# Patient Record
Sex: Male | Born: 1970 | Race: Black or African American | Hispanic: No | Marital: Married | Smoking: Never smoker
Health system: Southern US, Community
[De-identification: ages and names within clinical notes are randomized; demographics above are authoritative.]

## PROBLEM LIST (undated history)

## (undated) DIAGNOSIS — T7840XA Allergy, unspecified, initial encounter: Secondary | ICD-10-CM

## (undated) DIAGNOSIS — I10 Essential (primary) hypertension: Secondary | ICD-10-CM

## (undated) DIAGNOSIS — M549 Dorsalgia, unspecified: Secondary | ICD-10-CM

## (undated) DIAGNOSIS — E7800 Pure hypercholesterolemia, unspecified: Secondary | ICD-10-CM

## (undated) HISTORY — DX: Allergy, unspecified, initial encounter: T78.40XA

---

## 1999-08-17 ENCOUNTER — Emergency Department (HOSPITAL_COMMUNITY): Admission: EM | Admit: 1999-08-17 | Discharge: 1999-08-17 | Payer: Self-pay | Admitting: Emergency Medicine

## 1999-08-17 ENCOUNTER — Encounter: Payer: Self-pay | Admitting: Emergency Medicine

## 2002-12-25 ENCOUNTER — Emergency Department (HOSPITAL_COMMUNITY): Admission: EM | Admit: 2002-12-25 | Discharge: 2002-12-25 | Payer: Self-pay | Admitting: Emergency Medicine

## 2006-10-13 ENCOUNTER — Emergency Department (HOSPITAL_COMMUNITY): Admission: EM | Admit: 2006-10-13 | Discharge: 2006-10-13 | Payer: Self-pay | Admitting: Family Medicine

## 2007-02-15 ENCOUNTER — Emergency Department (HOSPITAL_COMMUNITY): Admission: EM | Admit: 2007-02-15 | Discharge: 2007-02-15 | Payer: Self-pay | Admitting: Family Medicine

## 2011-06-02 LAB — POCT URINALYSIS DIP (DEVICE)
Bilirubin Urine: NEGATIVE
Glucose, UA: NEGATIVE
Ketones, ur: NEGATIVE
Nitrite: NEGATIVE
Operator id: 116391
Protein, ur: NEGATIVE
Specific Gravity, Urine: 1.02
Urobilinogen, UA: 0.2
pH: 5.5

## 2011-11-10 ENCOUNTER — Emergency Department (HOSPITAL_COMMUNITY)
Admission: EM | Admit: 2011-11-10 | Discharge: 2011-11-10 | Disposition: A | Discharge status: Home / Self Care | Payer: BC Managed Care – PPO | Attending: Family Medicine | Admitting: Family Medicine

## 2011-11-10 ENCOUNTER — Emergency Department (HOSPITAL_COMMUNITY): Payer: BC Managed Care – PPO

## 2011-11-10 ENCOUNTER — Encounter (HOSPITAL_COMMUNITY): Payer: Self-pay | Admitting: Emergency Medicine

## 2011-11-10 DIAGNOSIS — M79671 Pain in right foot: Secondary | ICD-10-CM

## 2011-11-10 DIAGNOSIS — M79609 Pain in unspecified limb: Secondary | ICD-10-CM | POA: Insufficient documentation

## 2011-11-10 NOTE — ED Notes (Signed)
PT. REPORTS RIGHT FOOT PAIN/SWELLING ONSET Wednesday LAST WEEK AFTER PLAYING BASKETBALL , DENIES INJURY OR FALL .

## 2011-11-10 NOTE — Progress Notes (Signed)
Orthopedic Tech Progress Note Patient Details:  Keith French Dec 07, 1970 [id number]  Other Ortho Devices Type of Ortho Device: Other (comment) (ace wrap ) Ortho Device Location: (R) LE Ortho Device Interventions: Application   Jennye Moccasin 11/10/2011, 9:17 PM

## 2011-11-10 NOTE — ED Provider Notes (Signed)
History     CSN: [id number]  Arrival date & time 11/10/11  1844   First MD Initiated Contact with Patient 11/10/11 2008      Chief Complaint  Patient presents with  . Foot Pain    (Consider location/radiation/quality/duration/timing/severity/associated sxs/prior treatment) Patient is a 41 y.o. male presenting with lower extremity pain. The history is provided by the patient. No language interpreter was used.  Foot Pain This is a new problem. The current episode started in the past 7 days. The problem occurs daily. The problem has been unchanged. Associated symptoms include arthralgias. The symptoms are aggravated by exertion and walking.  Patient reports playing basketball last week, developed pain across the top of his right foot.  No known injury.  History reviewed. No pertinent past medical history.  History reviewed. No pertinent past surgical history.  No family history on file.  History  Substance Use Topics  . Smoking status: Never Smoker   . Smokeless tobacco: Not on file  . Alcohol Use: No      Review of Systems  Musculoskeletal: Positive for arthralgias.  All other systems reviewed and are negative.    Allergies  Penicillins  Home Medications  No current outpatient prescriptions on file.  BP 143/88  Pulse 72  Temp(Src) 98.2 F (36.8 C) (Oral)  Resp 16  SpO2 99%  Physical Exam  Nursing note and vitals reviewed. Constitutional: He is oriented to person, place, and time. He appears well-developed and well-nourished.  HENT:  Head: Normocephalic and atraumatic.  Eyes: Conjunctivae are normal. Pupils are equal, round, and reactive to light.  Neck: Normal range of motion. Neck supple.  Cardiovascular: Normal rate, regular rhythm, normal heart sounds and intact distal pulses.   Pulmonary/Chest: Effort normal and breath sounds normal.  Abdominal: Soft. Bowel sounds are normal.  Musculoskeletal: Normal range of motion. He exhibits tenderness.   Feet:  Neurological: He is alert and oriented to person, place, and time.  Skin: Skin is warm and dry.  Psychiatric: He has a normal mood and affect. His behavior is normal. Judgment and thought content normal.    ED Course  Procedures (including critical care time)  Labs Reviewed - No data to display Dg Foot Complete Right  11/10/2011  *RADIOLOGY REPORT*  Clinical Data: 41 year old male with right foot pain following injury.  RIGHT FOOT COMPLETE - 3+ VIEW  Comparison: None  Findings: There is no evidence of acute fracture, subluxation, or dislocation. The Lisfranc joints are intact. No focal bony lesions are identified. There is no evidence of radiopaque foreign body.  The joint spaces are unremarkable.  IMPRESSION: No bony abnormalities identified.  Original Report Authenticated By: Rosendo Gros, M.D.     No diagnosis found.  No evidence of bony abnormality on foot films.    Likely foot sprain.  MDM          Jimmye Norman, NP 11/10/11 2039

## 2011-11-10 NOTE — ED Notes (Signed)
States he has pain on top of right foot. States he was playing basketball 2 days ago. Noticed pain in foot after playing game. Denies any injury.

## 2011-11-10 NOTE — Discharge Instructions (Signed)
Foot Sprain  The muscles and cord like structures which attach muscle to bone (tendons) that surround the feet are made up of units. A foot sprain can occur at the weakest spot in any of these units. This condition is most often caused by injury to or overuse of the foot, as from playing contact sports, or aggravating a previous injury, or from poor conditioning, or obesity.  SYMPTOMS  · Pain with movement of the foot.  · Tenderness and swelling at the injury site.  · Loss of strength is present in moderate or severe sprains.  THE THREE GRADES OR SEVERITY OF FOOT SPRAIN ARE:  · Mild (Grade I): Slightly pulled muscle without tearing of muscle or tendon fibers or loss of strength.  · Moderate (Grade II): Tearing of fibers in a muscle, tendon, or at the attachment to bone, with small decrease in strength.  · Severe (Grade III): Rupture of the muscle-tendon-bone attachment, with separation of fibers. Severe sprain requires surgical repair. Often repeating (chronic) sprains are caused by overuse. Sudden (acute) sprains are caused by direct injury or over-use.  DIAGNOSIS   Diagnosis of this condition is usually by your own observation. If problems continue, a caregiver may be required for further evaluation and treatment. X-rays may be required to make sure there are not breaks in the bones (fractures) present. Continued problems may require physical therapy for treatment.  PREVENTION  · Use strength and conditioning exercises appropriate for your sport.  · Warm up properly prior to working out.  · Use athletic shoes that are made for the sport you are participating in.  · Allow adequate time for healing. Early return to activities makes repeat injury more likely, and can lead to an unstable arthritic foot that can result in prolonged disability. Mild sprains generally heal in 3 to 10 days, with moderate and severe sprains taking 2 to 10 weeks. Your caregiver can help you determine the proper time required for  healing.  HOME CARE INSTRUCTIONS   · Apply ice to the injury for 15 to 20 minutes, 3 to 4 times per day. Put the ice in a plastic bag and place a towel between the bag of ice and your skin.  · An elastic wrap (like an Ace bandage) may be used to keep swelling down.  · Keep foot above the level of the heart, or at least raised on a footstool, when swelling and pain are present.  · Try to avoid use other than gentle range of motion while the foot is painful. Do not resume use until instructed by your caregiver. Then begin use gradually, not increasing use to the point of pain. If pain does develop, decrease use and continue the above measures, gradually increasing activities that do not cause discomfort, until you gradually achieve normal use.  · Use crutches if and as instructed, and for the length of time instructed.  · Keep injured foot and ankle wrapped between treatments.  · Massage foot and ankle for comfort and to keep swelling down. Massage from the toes up towards the knee.  · Only take over-the-counter or prescription medicines for pain, discomfort, or fever as directed by your caregiver.  SEEK IMMEDIATE MEDICAL CARE IF:   · Your pain and swelling increase, or pain is not controlled with medications.  · You have loss of feeling in your foot or your foot turns cold or blue.  · You develop new, unexplained symptoms, or an increase of the symptoms that brought you   to your caregiver.  MAKE SURE YOU:   · Understand these instructions.  · Will watch your condition.  · Will get help right away if you are not doing well or get worse.  Document Released: 01/23/2002 Document Revised: 07/23/2011 Document Reviewed: 03/22/2008  Exit[employer]® Patient Information ©2012 [employer].

## 2011-11-11 NOTE — ED Provider Notes (Signed)
Medical screening examination/treatment/procedure(s) were performed by non-physician practitioner and as supervising physician I was immediately available for consultation/collaboration.   Celene Kras, MD 11/11/11 [phone]

## 2012-01-06 ENCOUNTER — Ambulatory Visit: Payer: BC Managed Care – PPO | Admitting: Internal Medicine

## 2012-01-06 DIAGNOSIS — Z0289 Encounter for other administrative examinations: Secondary | ICD-10-CM

## 2014-02-20 ENCOUNTER — Encounter (HOSPITAL_COMMUNITY): Payer: Self-pay | Admitting: Emergency Medicine

## 2014-02-20 ENCOUNTER — Emergency Department (HOSPITAL_COMMUNITY)
Admission: EM | Admit: 2014-02-20 | Discharge: 2014-02-20 | Disposition: A | Discharge status: Home / Self Care | Payer: BC Managed Care – PPO | Attending: Emergency Medicine | Admitting: Emergency Medicine

## 2014-02-20 DIAGNOSIS — R21 Rash and other nonspecific skin eruption: Secondary | ICD-10-CM

## 2014-02-20 DIAGNOSIS — Z880 Allergy status to penicillin: Secondary | ICD-10-CM | POA: Insufficient documentation

## 2014-02-20 DIAGNOSIS — IMO0002 Reserved for concepts with insufficient information to code with codable children: Secondary | ICD-10-CM | POA: Insufficient documentation

## 2014-02-20 HISTORY — DX: Dorsalgia, unspecified: M54.9

## 2014-02-20 MED ORDER — DEXAMETHASONE SODIUM PHOSPHATE 10 MG/ML IJ SOLN
10.0000 mg | Freq: Once | INTRAMUSCULAR | Status: AC
  Administered 2014-02-20: 10 mg via INTRAVENOUS
  Filled 2014-02-20: qty 1

## 2014-02-20 MED ORDER — HYDROXYZINE HCL 25 MG PO TABS: 25.0000 mg | ORAL_TABLET | Freq: Four times a day (QID) | ORAL | Status: DC

## 2014-02-20 MED ORDER — FAMOTIDINE IN NACL 20-0.9 MG/50ML-% IV SOLN
20.0000 mg | Freq: Once | INTRAVENOUS | Status: AC
  Administered 2014-02-20: 20 mg via INTRAVENOUS
  Filled 2014-02-20: qty 50

## 2014-02-20 MED ORDER — FAMOTIDINE 20 MG PO TABS: 20.0000 mg | ORAL_TABLET | Freq: Two times a day (BID) | ORAL | Status: DC | PRN

## 2014-02-20 MED ORDER — SODIUM CHLORIDE 0.9 % IV BOLUS (SEPSIS)
1000.0000 mL | Freq: Once | INTRAVENOUS | Status: AC
  Administered 2014-02-20: 1000 mL via INTRAVENOUS

## 2014-02-20 NOTE — ED Notes (Signed)
Joni ReiningNicole, [place], and Dr. Manus Gunningancour at the bedside.

## 2014-02-20 NOTE — ED Notes (Signed)
Discussed patient condition to WebsterNicole, [place]. Recent medications he took, and large rash on upper back.  She acknowledges and will see patient soon.

## 2014-02-20 NOTE — ED Notes (Signed)
Patient reports he took benadryl around 3 today that didn't help.

## 2014-02-20 NOTE — ED Notes (Signed)
Patient states that he took a 800mg  Ibuprofen and a muscle relaxer last night and woke up this am with upper back swelling and itchiness.  Patient has area on upper back/shoulders that is red and areas of edema.  Patient denies any shortness of breath or tongue swelling.  Patient does not have any other areas on body.  Patient is CAOx3.

## 2014-02-20 NOTE — ED Notes (Signed)
Joni ReiningNicole, PA-C at the bedside. Updated on patient's rash, small improvement of reddness noted in middle of back.  No further spreading noted.

## 2014-02-20 NOTE — Discharge Instructions (Signed)
Please follow with your primary care doctor in the next 2 days for a check-up. They must obtain records for further management.  ° °Do not hesitate to return to the Emergency Department for any new, worsening or concerning symptoms.  ° °

## 2014-02-20 NOTE — ED Provider Notes (Signed)
CSN: [id number]     Arrival date & time 02/20/14  1850 History  This chart was scribed for non-physician practitioner, Wynetta EmeryNicole Roselani Grajeda, PA-C working with Glynn OctaveStephen Rancour, MD by Greggory StallionKayla Andersen, ED scribe. This patient was seen in room TR07C/TR07C and the patient's care was started at 7:46 PM.   Chief Complaint  Patient presents with  . Allergic Reaction   The history is provided by the patient. No language interpreter was used.   HPI Comments: Keith LyonsBrian L Reale is a 43 y.o. male who presents to the Emergency Department complaining of gradual onset, worsening rash to his upper back that started this morning when he woke up. States there is redness, swelling and itching. Pt states he took 800 mg ibuprofen and flexeril last night. States he has taken ibuprofen in the past and has never had any problems. He is unsure if he might be having an allergic reaction to one of them. Denies new soaps, lotions, detergents or pets. Pt has taken benadryl with no relief. Denies lip swelling, tongue swelling, trouble swallowing, difficulty breathing, wheezing, SOB, abdominal pain, nausea, emesis.   Past Medical History  Diagnosis Date  . Back pain    History reviewed. No pertinent past surgical history. History reviewed. No pertinent family history. History  Substance Use Topics  . Smoking status: Never Smoker   . Smokeless tobacco: Not on file  . Alcohol Use: Yes     Comment: socially    Review of Systems  HENT: Negative for facial swelling and trouble swallowing.   Respiratory: Negative for shortness of breath and wheezing.   Gastrointestinal: Negative for nausea, vomiting and abdominal pain.  Skin: Positive for rash.  All other systems reviewed and are negative.  Allergies  Penicillins  Home Medications   Prior to Admission medications   Medication Sig Start Date End Date Taking? Authorizing Provider  diphenhydrAMINE (BENADRYL) 2 % cream Apply 1 application topically 3 (three) times daily as  needed for itching.   Yes Historical Provider, MD  famotidine (PEPCID) 20 MG tablet Take 1 tablet (20 mg total) by mouth 2 (two) times daily as needed (itching). 02/20/14   Madelynne Lasker, PA-C  hydrOXYzine (ATARAX/VISTARIL) 25 MG tablet Take 1 tablet (25 mg total) by mouth every 6 (six) hours. 02/20/14   Alfons Sulkowski, PA-C   BP 137/91  Pulse 62  Temp(Src) 98.6 F (37 C) (Oral)  Resp 20  SpO2 100%  Physical Exam  Nursing note and vitals reviewed. Constitutional: He is oriented to person, place, and time. He appears well-developed and well-nourished. No distress.  HENT:  Head: Normocephalic and atraumatic.  Mouth/Throat: Oropharynx is clear and moist.  Eyes: Conjunctivae and EOM are normal. Pupils are equal, round, and reactive to light.  Cardiovascular: Normal rate, regular rhythm and intact distal pulses.   Pulmonary/Chest: Effort normal and breath sounds normal. No stridor. No respiratory distress. He has no wheezes. He has no rales. He exhibits no tenderness.  No stridor or drooling. No posterior pharynx edema, lip or tongue swelling. Pt reclining comfortably, speaking in complete sentences.   No Wheezing, excellent air movement in all fields.     Abdominal: Soft. There is no tenderness.  Musculoskeletal: Normal range of motion.  Neurological: He is alert and oriented to person, place, and time.  Skin: Rash noted.     Erythematous indurated rash to upper back(see diagram) clear drainage. Peau d'orange appearance  Psychiatric: He has a normal mood and affect.    ED Course  Procedures (including  critical care time)  DIAGNOSTIC STUDIES: Oxygen Saturation is 98% on RA, normal by my interpretation.    COORDINATION OF CARE: 7:48 PM-Discussed treatment plan which includes speaking with Dr. Manus Gunningancour with pt at bedside and pt agreed to plan.   7:49 PM-Dr. Manus Gunningancour evaluated pt. He suggests decadron and observing pt for a little bit.  Labs Review Labs Reviewed - No data to  display  Imaging Review No results found.   EKG Interpretation None      MDM   Final diagnoses:  Rash    Filed Vitals:   02/20/14 1912 02/20/14 2125  BP: 153/88 137/91  Pulse: 80 62  Temp: 98.9 F (37.2 C) 98.6 F (37 C)  TempSrc: Oral Oral  Resp: 16 20  SpO2: 98% 100%    Medications  dexamethasone (DECADRON) injection 10 mg (10 mg Intravenous Given 02/20/14 2006)  famotidine (PEPCID) IVPB 20 mg (0 mg Intravenous Stopped 02/20/14 2036)  sodium chloride 0.9 % bolus 1,000 mL (0 mLs Intravenous Stopped 02/20/14 2112)    Keith French is a 43 y.o. male presenting with pruritic rash to upper back worsening over the course of the day. Patient had ibuprofen and Flexeril which he had never had before last night. Does not meet criteria for anaphylaxis and epinephrine administration: no multisystem involvement, airway compromise or hypotension. Pt given IVF, decadron, benadryl and pepcid with significant improvement in ED. Discharged with meds for symptom control.  This is a shared visit with the attending physician who personally evaluated the patient and agrees with the care plan.   Evaluation does not show pathology that would require ongoing emergent intervention or inpatient treatment. Pt is hemodynamically stable and mentating appropriately. Discussed findings and plan with patient/guardian, who agrees with care plan. All questions answered. Return precautions discussed and outpatient follow up given.   Discharge Medication List as of 02/20/2014 10:09 PM    START taking these medications   Details  famotidine (PEPCID) 20 MG tablet Take 1 tablet (20 mg total) by mouth 2 (two) times daily as needed (itching)., Starting 02/20/2014, Until Discontinued, Print    hydrOXYzine (ATARAX/VISTARIL) 25 MG tablet Take 1 tablet (25 mg total) by mouth every 6 (six) hours., Starting 02/20/2014, Until Discontinued, Print           I personally performed the services described in this  documentation, which was scribed in my presence. The recorded information has been reviewed and is accurate.  Joni Reiningicole Noami Bove, PA-C 02/21/14 [phone]

## 2014-02-21 NOTE — ED Provider Notes (Signed)
Medical screening examination/treatment/procedure(s) were conducted as a shared visit with non-physician practitioner(s) and myself.  I personally evaluated the patient during the encounter.  Pruritic, indurated area to upper back after taking ibuprofen and flexeril. No CP or SOB. No oral or genital lesions.   EKG Interpretation None       Glynn OctaveStephen Kamaiyah Uselton, MD 02/21/14 [phone]

## 2016-03-05 ENCOUNTER — Emergency Department (HOSPITAL_COMMUNITY)
Admission: EM | Admit: 2016-03-05 | Discharge: 2016-03-05 | Disposition: A | Discharge status: Home / Self Care | Payer: BC Managed Care – PPO | Attending: Emergency Medicine | Admitting: Emergency Medicine

## 2016-03-05 ENCOUNTER — Encounter (HOSPITAL_COMMUNITY): Payer: Self-pay | Admitting: *Deleted

## 2016-03-05 DIAGNOSIS — H6123 Impacted cerumen, bilateral: Secondary | ICD-10-CM

## 2016-03-05 NOTE — ED Notes (Signed)
The pt has been unable to hear frommhis rt ear for  2 days no paijn  No distress

## 2016-03-05 NOTE — ED Provider Notes (Signed)
CSN: [id number]     Arrival date & time 03/05/16  0311 History   First MD Initiated Contact with Patient 03/05/16 0424     Chief Complaint  Patient presents with  . Ear Fullness     (Consider location/radiation/quality/duration/timing/severity/associated sxs/prior Treatment) HPI Comments: Patient reports decreased hearing in the right ear without pain, drainage, fever, congestion or sore throat. He states the left ear is starting to feel clogged. History of wax build up.  Patient is a 45 y.o. male presenting with plugged ear sensation. The history is provided by the patient. No language interpreter was used.  Ear Fullness This is a new problem. The current episode started in the past 7 days. The problem occurs constantly. Pertinent negatives include no congestion, coughing, fever, nausea or sore throat.    Past Medical History  Diagnosis Date  . Back pain    History reviewed. No pertinent past surgical history. No family history on file. Social History  Substance Use Topics  . Smoking status: Never Smoker   . Smokeless tobacco: None  . Alcohol Use: Yes     Comment: socially    Review of Systems  Constitutional: Negative for fever.  HENT: Positive for hearing loss. Negative for congestion, sore throat and tinnitus.   Respiratory: Negative for cough.   Gastrointestinal: Negative for nausea.      Allergies  Penicillins  Home Medications   Prior to Admission medications   Not on File   BP 161/100 mmHg  Pulse 70  Temp(Src) 97.8 F (36.6 C) (Oral)  Resp 17  Ht 6\' 1"  (1.854 m)  Wt 108.183 kg  BMI 31.47 kg/m2  SpO2 99% Physical Exam  Constitutional: He is oriented to person, place, and time. He appears well-developed and well-nourished.  HENT:  Bilateral cerumen impaction. External ear nontender to movement.   Neck: Normal range of motion.  Pulmonary/Chest: Effort normal.  Musculoskeletal: Normal range of motion.  Neurological: He is alert and oriented to  person, place, and time.  Skin: Skin is warm and dry.  Psychiatric: He has a normal mood and affect.    ED Course  Procedures (including critical care time) Labs Review Labs Reviewed - No data to display  Imaging Review No results found. I have personally reviewed and evaluated these images and lab results as part of my medical decision-making.   EKG Interpretation None      MDM   Final diagnoses:  Cerumen impaction, bilateral    Lavage provided for complete clearing of bilateral external ear canals. Patient is asymptomatic.    Elpidio AnisShari Remmy Riffe, PA-C 03/05/16 [phone]   Raeford RazorStephen Kohut, MD 03/09/16 [phone]

## 2016-03-05 NOTE — Discharge Instructions (Signed)
Cerumen Impaction The structures of the external ear canal secrete a waxy substance known as cerumen. Excess cerumen can build up in the ear canal, causing a condition known as cerumen impaction. Cerumen impaction can cause ear pain and disrupt the function of the ear. The rate of cerumen production differs for each individual. In certain individuals, the configuration of the ear canal may decrease his or her ability to naturally remove cerumen. CAUSES Cerumen impaction is caused by excessive cerumen production or buildup. RISK FACTORS  Frequent use of swabs to clean ears.  Having narrow ear canals.  Having eczema.  Being dehydrated. SIGNS AND SYMPTOMS  Diminished hearing.  Ear drainage.  Ear pain.  Ear itch. TREATMENT Treatment may involve:  Over-the-counter or prescription ear drops to soften the cerumen.  Removal of cerumen by a health care provider. This may be done with:  Irrigation with warm water. This is the most common method of removal.  Ear curettes and other instruments.  Surgery. This may be done in severe cases. HOME CARE INSTRUCTIONS  Take medicines only as directed by your health care provider.  Do not insert objects into the ear with the intent of cleaning the ear. PREVENTION  Do not insert objects into the ear, even with the intent of cleaning the ear. Removing cerumen as a part of normal hygiene is not necessary, and the use of swabs in the ear canal is not recommended.  Drink enough water to keep your urine clear or pale yellow.  Control your eczema if you have it. SEEK MEDICAL CARE IF:  You develop ear pain.  You develop bleeding from the ear.  The cerumen does not clear after you use ear drops as directed.   This information is not intended to replace advice given to you by your health care provider. Make sure you discuss any questions you have with your health care provider.   Document Released: 09/10/2004 Document Revised: 08/24/2014  Document Reviewed: 03/20/2015 Elsevier Interactive Patient Education 2016 [employer]  

## 2016-03-05 NOTE — ED Notes (Signed)
Irrigated bilateral ears - able to see eardrum in each ear. Patient reports he is able to hear better.

## 2016-06-23 ENCOUNTER — Ambulatory Visit
Admission: EM | Admit: 2016-06-23 | Discharge: 2016-06-23 | Disposition: A | Discharge status: Home / Self Care | Payer: BLUE CROSS/BLUE SHIELD | Attending: Family Medicine | Admitting: Family Medicine

## 2016-06-23 ENCOUNTER — Encounter: Payer: Self-pay | Admitting: *Deleted

## 2016-06-23 DIAGNOSIS — S39012A Strain of muscle, fascia and tendon of lower back, initial encounter: Secondary | ICD-10-CM | POA: Diagnosis not present

## 2016-06-23 MED ORDER — CYCLOBENZAPRINE HCL 10 MG PO TABS: 10.0000 mg | ORAL_TABLET | Freq: Every day | ORAL | 0 refills | Status: DC

## 2016-06-23 MED ORDER — HYDROCODONE-ACETAMINOPHEN 5-325 MG PO TABS: ORAL_TABLET | ORAL | 0 refills | Status: DC

## 2016-06-23 NOTE — ED Provider Notes (Signed)
[facility]    CSN: [id number] Arrival date & time: 06/23/16  1553     History   Chief Complaint Chief Complaint  Patient presents with  . Back Pain    HPI Keith French is a 45 y.o. male.   The history is provided by the patient.  Back Pain  Location:  Lumbar spine Quality:  Aching Radiates to:  L posterior upper leg Pain severity:  Moderate Pain is:  Same all the time Onset quality:  Sudden Duration:  2 weeks Timing:  Constant Progression:  Worsening Chronicity:  New Context comment:  Getting out of shower felt sudden sharp pain; denies any falls or direct trauma injury Relieved by:  Nothing Ineffective treatments:  NSAIDs and OTC medications Associated symptoms: no abdominal pain, no abdominal swelling, no bladder incontinence, no bowel incontinence, no chest pain, no dysuria, no fever, no headaches, no leg pain, no numbness, no paresthesias, no pelvic pain, no perianal numbness, no tingling, no weakness and no weight loss   Risk factors: no hx of cancer, no hx of osteoporosis, no lack of exercise, no menopause, not obese, not pregnant, no recent surgery, no steroid use and no vascular disease     Past Medical History:  Diagnosis Date  . Back pain     There are no active problems to display for this patient.   History reviewed. No pertinent surgical history.     Home Medications    Prior to Admission medications   Medication Sig Start Date End Date Taking? Authorizing Provider  cyclobenzaprine (FLEXERIL) 10 MG tablet Take 1 tablet (10 mg total) by mouth at bedtime. 06/23/16   Payton Mccallumrlando Jodee Wagenaar, MD  HYDROcodone-acetaminophen (NORCO/VICODIN) 5-325 MG tablet 1-2 tabs po qhs prn 06/23/16   Payton Mccallumrlando Aleksandr Pellow, MD    Family History History reviewed. No pertinent family history.  Social History Social History  Substance Use Topics  . Smoking status: Never Smoker  . Smokeless tobacco: Never Used  . Alcohol use Yes     Comment: socially      Allergies   Penicillins   Review of Systems Review of Systems  Constitutional: Negative for fever and weight loss.  Cardiovascular: Negative for chest pain.  Gastrointestinal: Negative for abdominal pain and bowel incontinence.  Genitourinary: Negative for bladder incontinence, dysuria and pelvic pain.  Musculoskeletal: Positive for back pain.  Neurological: Negative for tingling, weakness, numbness, headaches and paresthesias.     Physical Exam Triage Vital Signs ED Triage Vitals  Enc Vitals Group     BP 06/23/16 1701 (!) 160/107     Pulse Rate 06/23/16 1701 74     Resp 06/23/16 1701 16     Temp 06/23/16 1701 97.9 F (36.6 C)     Temp Source 06/23/16 1701 Oral     SpO2 06/23/16 1701 98 %     Weight 06/23/16 1702 231 lb (104.8 kg)     Height 06/23/16 1702 6\' 1"  (1.854 m)     Head Circumference --      Peak Flow --      Pain Score 06/23/16 1705 8     Pain Loc --      Pain Edu? --      Excl. in [place]? --    No data found.   Updated Vital Signs BP (!) 160/107 (BP Location: Left Arm)   Pulse 74   Temp 97.9 F (36.6 C) (Oral)   Resp 16   Ht 6\' 1"  (1.854 m)   Wt  231 lb (104.8 kg)   SpO2 98%   BMI 30.48 kg/m   Visual Acuity Right Eye Distance:   Left Eye Distance:   Bilateral Distance:    Right Eye Near:   Left Eye Near:    Bilateral Near:     Physical Exam  Constitutional: He appears well-developed and well-nourished. No distress.  Neck: Normal range of motion. Neck supple. No tracheal deviation present.  Pulmonary/Chest: Effort normal. No stridor. No respiratory distress.  Musculoskeletal:       Cervical back: He exhibits normal range of motion, no tenderness, no bony tenderness, no swelling, no edema, no deformity, no laceration, no pain, no spasm and normal pulse.       Lumbar back: He exhibits tenderness (over the left  lumbar paraspinous muscles) and spasm. He exhibits normal range of motion, no bony tenderness, no swelling, no edema, no deformity,  no laceration, no pain and normal pulse.  Neurological: He is alert. He has normal reflexes. He displays normal reflexes. He exhibits normal muscle tone. Coordination normal.  Skin: No rash noted. He is not diaphoretic.  Nursing note and vitals reviewed.    UC Treatments / Results  Labs (all labs ordered are listed, but only abnormal results are displayed) Labs Reviewed - No data to display  EKG  EKG Interpretation None       Radiology No results found.  Procedures Procedures (including critical care time)  Medications Ordered in UC Medications - No data to display   Initial Impression / Assessment and Plan / UC Course  I have reviewed the triage vital signs and the nursing notes.  Pertinent labs & imaging results that were available during my care of the patient were reviewed by me and considered in my medical decision making (see chart for details).  Clinical Course       Final Clinical Impressions(s) / UC Diagnoses   Final diagnoses:  Strain of lumbar region, initial encounter    New Prescriptions Discharge Medication List as of 06/23/2016  5:24 PM    START taking these medications   Details  cyclobenzaprine (FLEXERIL) 10 MG tablet Take 1 tablet (10 mg total) by mouth at bedtime., Starting Tue 06/23/2016, Normal    HYDROcodone-acetaminophen (NORCO/VICODIN) 5-325 MG tablet 1-2 tabs po qhs prn, Print       1. diagnosis reviewed with patient 2. rx as per orders above; reviewed possible side effects, interactions, risks and benefits  3. Recommend supportive treatment with heat, stretches 4. Follow-up prn if symptoms worsen or don't improve   Payton Mccallumrlando Kahla Risdon, MD 06/23/16 1941

## 2016-06-23 NOTE — ED Triage Notes (Signed)
LS spine region pain, left side, x3 weeks. Worse today. Denies injury.

## 2016-06-26 ENCOUNTER — Telehealth: Payer: Self-pay | Admitting: *Deleted

## 2016-06-26 NOTE — Telephone Encounter (Signed)
Courtesy call back, verified DOB, patient reported feeling the same, but does have a follow up appointment with PCP. Advised patient to follow up with [facility] or PCP if symptoms become worse.

## 2016-11-23 ENCOUNTER — Ambulatory Visit: Payer: Self-pay | Admitting: Family Medicine

## 2018-04-12 ENCOUNTER — Other Ambulatory Visit: Payer: Self-pay | Admitting: Family Medicine

## 2018-04-12 DIAGNOSIS — R945 Abnormal results of liver function studies: Principal | ICD-10-CM

## 2018-04-12 DIAGNOSIS — R7989 Other specified abnormal findings of blood chemistry: Secondary | ICD-10-CM

## 2018-04-25 ENCOUNTER — Other Ambulatory Visit: Payer: BLUE CROSS/BLUE SHIELD

## 2018-04-26 ENCOUNTER — Ambulatory Visit
Admission: RE | Admit: 2018-04-26 | Discharge: 2018-04-26 | Disposition: A | Discharge status: Home / Self Care | Payer: BLUE CROSS/BLUE SHIELD | Source: Ambulatory Visit | Attending: Family Medicine | Admitting: Family Medicine

## 2018-04-26 DIAGNOSIS — R945 Abnormal results of liver function studies: Principal | ICD-10-CM

## 2018-04-26 DIAGNOSIS — R7989 Other specified abnormal findings of blood chemistry: Secondary | ICD-10-CM

## 2018-04-27 ENCOUNTER — Other Ambulatory Visit: Payer: BLUE CROSS/BLUE SHIELD

## 2019-12-05 ENCOUNTER — Ambulatory Visit (INDEPENDENT_AMBULATORY_CARE_PROVIDER_SITE_OTHER): Payer: Non-veteran care

## 2019-12-05 ENCOUNTER — Ambulatory Visit
Admission: EM | Admit: 2019-12-05 | Discharge: 2019-12-05 | Disposition: A | Discharge status: Home / Self Care | Payer: Non-veteran care | Attending: Family Medicine | Admitting: Family Medicine

## 2019-12-05 ENCOUNTER — Encounter: Payer: Self-pay | Admitting: Emergency Medicine

## 2019-12-05 ENCOUNTER — Other Ambulatory Visit: Payer: Self-pay

## 2019-12-05 DIAGNOSIS — S92154A Nondisplaced avulsion fracture (chip fracture) of right talus, initial encounter for closed fracture: Secondary | ICD-10-CM | POA: Diagnosis not present

## 2019-12-05 DIAGNOSIS — W1789XA Other fall from one level to another, initial encounter: Secondary | ICD-10-CM | POA: Diagnosis not present

## 2019-12-05 HISTORY — DX: Pure hypercholesterolemia, unspecified: E78.00

## 2019-12-05 HISTORY — DX: Essential (primary) hypertension: I10

## 2019-12-05 MED ORDER — MELOXICAM 15 MG PO TABS: 15.0000 mg | ORAL_TABLET | Freq: Every day | ORAL | 0 refills | Status: DC | PRN

## 2019-12-05 NOTE — Discharge Instructions (Addendum)
Rest, ice, elevation.  Medication as prescribed.  See Podiatry for follow up.   Take care  Dr. Adriana Simas

## 2019-12-05 NOTE — ED Triage Notes (Signed)
Patient c/o twisting his right ankle on Friday after jumping off of a trailer. He is c/o pain and swelling.

## 2019-12-05 NOTE — ED Provider Notes (Signed)
[facility]    CSN: [id number] Arrival date & time: 12/05/19  0810  History   Chief Complaint Chief Complaint  Patient presents with  . Ankle Injury    right  . Ankle Pain   HPI  49 year old male presents with an ankle injury.  Patient states that he jumped down from a trailer on Friday and twisted his right ankle.  He states that initially he was doing okay but has been doing a fair amount of walking.  As a result he feels like he is pain has worsened.  He states that he was unable to ambulate today.  He reports pain and swelling of the right ankle.  He is most tender around the medial malleolus.  Rates his pain as 7/10 in severity.  Described as aching.  No relieving factors.  No other reported symptoms.  No other complaints.  Past Medical History:  Diagnosis Date  . Back pain   . High cholesterol   . Hypertension    Home Medications    Prior to Admission medications   Medication Sig Start Date End Date Taking? Authorizing Provider  amLODipine (NORVASC) 10 MG tablet Take 10 mg by mouth daily. 10/04/19   [provider]  atorvastatin (LIPITOR) 10 MG tablet Take 10 mg by mouth daily. 10/04/19   [provider]  meloxicam (MOBIC) 15 MG tablet Take 1 tablet (15 mg total) by mouth daily as needed for pain. 12/05/19   Coral Spikes, DO  metoprolol succinate (TOPROL-XL) 50 MG 24 hr tablet Take 50 mg by mouth daily. 10/04/19   [provider]   Social History Social History   Tobacco Use  . Smoking status: Never Smoker  . Smokeless tobacco: Never Used  Substance Use Topics  . Alcohol use: Yes    Comment: socially  . Drug use: No   Allergies   Penicillins   Review of Systems Review of Systems  Constitutional: Negative.   Musculoskeletal:       Right ankle pain and swelling.   Physical Exam Triage Vital Signs ED Triage Vitals  Enc Vitals Group     BP 12/05/19 0842 (!) 154/87     Pulse Rate 12/05/19 0842 75     Resp 12/05/19 0842  18     Temp 12/05/19 0842 98.2 F (36.8 C)     Temp Source 12/05/19 0842 Oral     SpO2 12/05/19 0842 100 %     Weight 12/05/19 0840 245 lb (111.1 kg)     Height 12/05/19 0840 6' (1.829 m)     Head Circumference --      Peak Flow --      Pain Score 12/05/19 0840 7     Pain Loc --      Pain Edu? --      Excl. in [place]? --    Updated Vital Signs BP (!) 154/87 (BP Location: Left Arm)   Pulse 75   Temp 98.2 F (36.8 C) (Oral)   Resp 18   Ht 6' (1.829 m)   Wt 111.1 kg   SpO2 100%   BMI 33.23 kg/m   Visual Acuity Right Eye Distance:   Left Eye Distance:   Bilateral Distance:    Right Eye Near:   Left Eye Near:    Bilateral Near:     Physical Exam Vitals and nursing note reviewed.  Constitutional:      General: He is not in acute distress.  Appearance: Normal appearance. He is not ill-appearing.  HENT:     Head: Normocephalic and atraumatic.  Eyes:     General:        Right eye: No discharge.        Left eye: No discharge.     Conjunctiva/sclera: Conjunctivae normal.  Pulmonary:     Effort: Pulmonary effort is normal. No respiratory distress.  Musculoskeletal:     Comments: Right ankle -diffuse swelling and tenderness over the medial malleolus.  Neurological:     Mental Status: He is alert.  Psychiatric:        Mood and Affect: Mood normal.        Behavior: Behavior normal.    UC Treatments / Results  Labs (all labs ordered are listed, but only abnormal results are displayed) Labs Reviewed - No data to display  EKG   Radiology DG Ankle Complete Right  Result Date: 12/05/2019 CLINICAL DATA:  Pain and swelling of the right ankle since a twisting injury 3 days ago. EXAM: RIGHT ANKLE - COMPLETE 3+ VIEW COMPARISON:  Radiographs of the right foot dated 11/10/2011 FINDINGS: There is no fracture or dislocation. Tiny avulsion of bone from the dorsal distal aspect of the talus which is new since the prior study. Multiple old avulsions from the tip of the medial  malleolus. These were present on the prior radiographs of 11/10/2011. There is circumferential soft tissue swelling at the ankle. IMPRESSION: Tiny avulsion of bone from the dorsal distal aspect of the talus. Soft tissue swelling. Electronically Signed   By: Francene Boyers M.D.   On: 12/05/2019 09:33    Procedures Procedures (including critical care time)  Medications Ordered in UC Medications - No data to display  Initial Impression / Assessment and Plan / UC Course  I have reviewed the triage vital signs and the nursing notes.  Pertinent labs & imaging results that were available during my care of the patient were reviewed by me and considered in my medical decision making (see chart for details).    49 year old male presents with an ankle injury.  X-ray revealed an avulsion fracture of the talus.  Nonweightbearing.  Crutches.  Advised to see podiatry.  Meloxicam as directed.  Supportive care.  Final Clinical Impressions(s) / UC Diagnoses   Final diagnoses:  Closed nondisplaced avulsion fracture of right talus, initial encounter     Discharge Instructions     Rest, ice, elevation.  Medication as prescribed.  See Podiatry for follow up.   Take care  Dr. Adriana Simas     ED Prescriptions    Medication Sig Dispense Auth. Provider   meloxicam (MOBIC) 15 MG tablet Take 1 tablet (15 mg total) by mouth daily as needed for pain. 30 tablet Tommie Sams, DO     PDMP not reviewed this encounter.   Tommie Sams, DO 12/05/19 1011

## 2019-12-24 ENCOUNTER — Other Ambulatory Visit: Payer: Self-pay

## 2019-12-24 ENCOUNTER — Encounter: Payer: Self-pay | Admitting: Emergency Medicine

## 2019-12-24 ENCOUNTER — Emergency Department
Admission: EM | Admit: 2019-12-24 | Discharge: 2019-12-24 | Disposition: A | Discharge status: Home / Self Care | Attending: Emergency Medicine | Admitting: Emergency Medicine

## 2019-12-24 ENCOUNTER — Emergency Department

## 2019-12-24 DIAGNOSIS — S93401D Sprain of unspecified ligament of right ankle, subsequent encounter: Secondary | ICD-10-CM | POA: Insufficient documentation

## 2019-12-24 DIAGNOSIS — R2241 Localized swelling, mass and lump, right lower limb: Secondary | ICD-10-CM | POA: Diagnosis not present

## 2019-12-24 DIAGNOSIS — X58XXXD Exposure to other specified factors, subsequent encounter: Secondary | ICD-10-CM | POA: Insufficient documentation

## 2019-12-24 DIAGNOSIS — S92154D Nondisplaced avulsion fracture (chip fracture) of right talus, subsequent encounter for fracture with routine healing: Secondary | ICD-10-CM | POA: Diagnosis present

## 2019-12-24 NOTE — ED Notes (Signed)
Pt has swelling in his right foot from where he twisted his ankle recently and got a mild fracture. Pt states that he didn't follow up with the ortho surgeon and that he's been walking on it and now it is more painful.

## 2019-12-24 NOTE — ED Triage Notes (Signed)
Pt states approx 3 weeks ago patient partially fractured his ankle, was given a brace, pt states continued to walk on it and now has continued and increased pain to R ankle.

## 2019-12-24 NOTE — Discharge Instructions (Addendum)
Your exam and x-ray today are consistent with a grade 2 ankle sprain and joint effusion.  No evidence of underlying fracture.  Previous bone avulsion appears resolved at this time.  Wear the cam walker for ambulation using crutches as needed.  Rest with the foot elevated, apply ice to reduce swelling.  Perform regular ankle exercises throughout the day.  Consider Epson salt soaks to reduce swelling.  Follow-up with Dr. Orland Jarred for ongoing symptom management.

## 2019-12-24 NOTE — ED Provider Notes (Signed)
[facility] Emergency Department Provider Note ____________________________________________  Time seen: 1135  I have reviewed the triage vital signs and the nursing notes.  HISTORY  Chief Complaint  Ankle Pain  HPI Keith French is a 49 y.o. male presents himself to the ED for subsequent evaluation of right ankle pain.  Patient describes 3 weeks ago he was evaluated and treated with initial fracture care of an avulsion to the right talar dome.  He was placed in a brace at that time and referred to podiatry.  Patient admits to continued pain and swelling to the right ankle.  He denies any interim injury, but has been using crutches to ambulate in the interim.   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  Past Medical History:  Diagnosis Date  . Back pain   . High cholesterol   . Hypertension     There are no problems to display for this patient.   History reviewed. No pertinent surgical history.  Prior to Admission medications   Medication Sig Start Date End Date Taking? Authorizing Provider  amLODipine (NORVASC) 10 MG tablet Take 10 mg by mouth daily. 10/04/19   [provider]  atorvastatin (LIPITOR) 10 MG tablet Take 10 mg by mouth daily. 10/04/19   [provider]  meloxicam (MOBIC) 15 MG tablet Take 1 tablet (15 mg total) by mouth daily as needed for pain. 12/05/19   Coral Spikes, DO  metoprolol succinate (TOPROL-XL) 50 MG 24 hr tablet Take 50 mg by mouth daily. 10/04/19   [provider]    Allergies Penicillins  History reviewed. No pertinent family history.  Social History Social History   Tobacco Use  . Smoking status: Never Smoker  . Smokeless tobacco: Never Used  Substance Use Topics  . Alcohol use: Yes    Comment: socially  . Drug use: No    Review of Systems  Constitutional: Negative for fever. Cardiovascular: Negative for chest pain. Respiratory: Negative  for shortness of breath. Musculoskeletal: Negative for back pain.  Right ankle pain as above. Skin: Negative for rash. Neurological: Negative for headaches, focal weakness or numbness. ____________________________________________  PHYSICAL EXAM:  VITAL SIGNS: ED Triage Vitals  Enc Vitals Group     BP 12/24/19 1113 (!) 142/98     Pulse Rate 12/24/19 1113 77     Resp 12/24/19 1113 18     Temp 12/24/19 1113 98.2 F (36.8 C)     Temp Source 12/24/19 1113 Oral     SpO2 12/24/19 1113 98 %     Weight 12/24/19 1110 245 lb (111.1 kg)     Height 12/24/19 1110 6' (1.829 m)     Head Circumference --      Peak Flow --      Pain Score 12/24/19 1110 3     Pain Loc --      Pain Edu? --      Excl. in [place]? --     Constitutional: Alert and oriented. Well appearing and in no distress. Head: Normocephalic and atraumatic. Eyes: Conjunctivae are normal. Normal extraocular movements Cardiovascular: Normal rate, regular rhythm. Normal distal pulses. Respiratory: Normal respiratory effort. No wheezes/rales/rhonchi. Gastrointestinal: Soft and nontender. No distention. Musculoskeletal: Right ankle without obvious deformity or dislocation.  Patient with a moderate joint effusion appreciated.  He is tender to palpation to the medial and dorsal ankle.  Active range of motion is appreciated.  No calf or, tenderness is elicited.  Negative anterior/posterior drawer.  Nontender with normal range of motion in all extremities.  Neurologic: Antalgic gait without ataxia. Normal gross sensation. No gross focal neurologic deficits are appreciated. Skin:  Skin is warm, dry and intact. No rash noted. ____________________________________________   RADIOLOGY  DG Right Ankle  IMPRESSION: Prior trauma medial malleolus, stable in appearance. The previously noted small avulsion arising from the dorsal distal talus is no longer evident. No acute appearing fracture is evident on current examination. Note that there is a  joint effusion. Question underlying ligamentous injury. _________________________________________  PROCEDURES  Ace bandage CAM Walker  Procedures ____________________________________________  INITIAL IMPRESSION / ASSESSMENT AND PLAN / ED COURSE  Patient with subsequent ED evaluation for continued right ankle pain and swelling.  Patient with soft tissue swelling and disability to the right ankle 3 weeks following initial evaluation.  He was found to have a questionable avulsion to the dorsal talus on that visit.  That bone defect is now resolved today.  With no indication of acute fracture or dislocation.  Patient is placed in a cam walker for support, and is again referred to podiatry for ongoing symptom management.  He is advised to rest, ice, elevate the foot, and perform range of motion exercises when seated.  He is also advised to discontinue any extracurricular landscaping activities for the next 2 weeks.  Return precautions have been reviewed.  CLOIS TREANOR was evaluated in Emergency Department on 12/24/2019 for the symptoms described in the history of present illness. He was evaluated in the context of the global COVID-19 pandemic, which necessitated consideration that the patient might be at risk for infection with the SARS-CoV-2 virus that causes COVID-19. Institutional protocols and algorithms that pertain to the evaluation of patients at risk for COVID-19 are in a state of rapid change based on information released by regulatory bodies including the CDC and federal and state organizations. These policies and algorithms were followed during the patient's care in the ED. ____________________________________________  FINAL CLINICAL IMPRESSION(S) / ED DIAGNOSES  Final diagnoses:  Sprain of right ankle, unspecified ligament, subsequent encounter      Lissa Hoard, PA-C 12/24/19 1206    Dionne Bucy, MD 12/24/19 1601

## 2021-01-20 ENCOUNTER — Ambulatory Visit (HOSPITAL_BASED_OUTPATIENT_CLINIC_OR_DEPARTMENT_OTHER): Admitting: Family Medicine

## 2021-01-23 ENCOUNTER — Other Ambulatory Visit: Payer: Self-pay

## 2021-01-23 ENCOUNTER — Ambulatory Visit (HOSPITAL_BASED_OUTPATIENT_CLINIC_OR_DEPARTMENT_OTHER): Admitting: Family Medicine

## 2021-01-23 ENCOUNTER — Encounter (HOSPITAL_BASED_OUTPATIENT_CLINIC_OR_DEPARTMENT_OTHER): Payer: Self-pay | Admitting: Family Medicine

## 2021-01-23 DIAGNOSIS — M25571 Pain in right ankle and joints of right foot: Secondary | ICD-10-CM | POA: Diagnosis not present

## 2021-01-23 DIAGNOSIS — I10 Essential (primary) hypertension: Secondary | ICD-10-CM | POA: Diagnosis not present

## 2021-01-23 DIAGNOSIS — E669 Obesity, unspecified: Secondary | ICD-10-CM | POA: Diagnosis not present

## 2021-01-23 DIAGNOSIS — E785 Hyperlipidemia, unspecified: Secondary | ICD-10-CM | POA: Diagnosis not present

## 2021-01-23 DIAGNOSIS — G8929 Other chronic pain: Secondary | ICD-10-CM

## 2021-01-23 MED ORDER — AMLODIPINE BESYLATE 10 MG PO TABS: 10.0000 mg | ORAL_TABLET | Freq: Every day | ORAL | 1 refills | Status: DC

## 2021-01-23 MED ORDER — METOPROLOL SUCCINATE ER 50 MG PO TB24: 50.0000 mg | ORAL_TABLET | Freq: Every day | ORAL | 1 refills | Status: DC

## 2021-01-23 MED ORDER — ATORVASTATIN CALCIUM 10 MG PO TABS: 10.0000 mg | ORAL_TABLET | Freq: Every day | ORAL | 1 refills | Status: DC

## 2021-01-23 NOTE — Progress Notes (Signed)
New Patient Office Visit  Subjective:  Patient ID: Keith French, male    DOB: 1971/01/15  Age: 50 y.o. MRN: [id number]  CC:  Chief Complaint  Patient presents with   Establish Care    Est Care - Former PCP Dr. Patria Mane at [facility]   Ankle Pain    Patient states he sprained his right ankle about a year ago and he is still having residual stiffness and pain. Patient states he was told he could have some bone spurs from the injury. He states that if he sits for a long time the ankle stiffens and is painful    HPI Keith French is a 50 year old male presenting to establish clinic.  He has current concerns today related to chronic right ankle pain.  Past medical history significant for dyslipidemia, hypertension.  Chronic right ankle pain: Reports that about 1 year ago he had an injury to the ankle when he was jumping down from a truck.  He has had intermittent discomfort/pain since then.  Endorses history of chronic right ankle injuries due to prior sports activities.  Thinks he might of had a fracture in the past as well, this did not require surgery.  Will have pain over medial and lateral aspect of ankle.  Pain will be worse when he has been sitting for a while and then goes to stand up and move around.  No significant swelling presently.  Hypertension: Currently taking amlodipine and metoprolol, has been on these for about 2 years.  Denies any issues with chest pain, lightheadedness, dizziness, headaches.  Needing refills on medications today.  Dyslipidemia: Has been taking atorvastatin for about 2 years.  Denies any issues with myalgias.  Has been tolerating medication well.  Patient works as a Optometrist.  Past Medical History:  Diagnosis Date   Back pain    High cholesterol    Hypertension     History reviewed. No pertinent surgical history.  Family History  Problem Relation Age of Onset   Hypertension Mother    Prostate cancer Father 31   Heart disease  Maternal Grandmother     Social History   Socioeconomic History   Marital status: Married    Spouse name: Not on file   Number of children: Not on file   Years of education: Not on file   Highest education level: Not on file  Occupational History   Not on file  Tobacco Use   Smoking status: Never   Smokeless tobacco: Never  Substance and Sexual Activity   Alcohol use: Yes    Comment: socially   Drug use: No   Sexual activity: Not on file  Other Topics Concern   Not on file  Social History Narrative   Not on file   Social Determinants of Health   Financial Resource Strain: Not on file  Food Insecurity: Not on file  Transportation Needs: Not on file  Physical Activity: Not on file  Stress: Not on file  Social Connections: Not on file  Intimate Partner Violence: Not on file    Objective:   Today's Vitals: BP 130/84   Pulse 65   Ht 6' (1.829 m)   Wt 250 lb 3.2 oz (113.5 kg)   SpO2 98%   BMI 33.93 kg/m   Physical Exam  Pleasant 50 year old male in no acute distress Cardiovascular exam with regular rate and rhythm, no murmurs appreciated Lungs clear to auscultation bilaterally Right ankle: No obvious deformity of right ankle.  No significant tenderness to palpation over ATFL, at location is where patient reports lateral ankle pain is primarily located.  No significant tenderness palpation along posterior tibial tendon, however this is location of primary medial sided ankle pain. Full ROM for plantarflexion, dorsiflexion, inversion and eversion.  Strength 5 out of 5 for all planes of motion. Neurovascularly intact.  No evidence of lymphatic disease.   Assessment & Plan:   Problem List Items Addressed This Visit       Cardiovascular and Mediastinum   Hypertension   Relevant Medications   amLODipine (NORVASC) 10 MG tablet   atorvastatin (LIPITOR) 10 MG tablet   metoprolol succinate (TOPROL-XL) 50 MG 24 hr tablet     Other   Dyslipidemia   Relevant  Medications   atorvastatin (LIPITOR) 10 MG tablet   Obesity (BMI 30.0-34.9)   Chronic pain of right ankle    Outpatient Encounter Medications as of 01/23/2021  Medication Sig   [DISCONTINUED] amLODipine (NORVASC) 10 MG tablet Take 10 mg by mouth daily.   [DISCONTINUED] atorvastatin (LIPITOR) 10 MG tablet Take 10 mg by mouth daily.   [DISCONTINUED] metoprolol succinate (TOPROL-XL) 50 MG 24 hr tablet Take 50 mg by mouth daily.   amLODipine (NORVASC) 10 MG tablet Take 1 tablet (10 mg total) by mouth daily.   atorvastatin (LIPITOR) 10 MG tablet Take 1 tablet (10 mg total) by mouth daily.   metoprolol succinate (TOPROL-XL) 50 MG 24 hr tablet Take 1 tablet (50 mg total) by mouth daily.   [DISCONTINUED] meloxicam (MOBIC) 15 MG tablet Take 1 tablet (15 mg total) by mouth daily as needed for pain. (Patient not taking: Reported on 01/23/2021)   No facility-administered encounter medications on file as of 01/23/2021.   Spent 45 minutes on this patient encounter, including preparation, chart review, face-to-face counseling with patient and coordination of care, and documentation of encounter  Follow-up: Return in about 4 weeks (around 02/20/2021) for Follow Up.  Complete CPE at next appointment with labs to be completed.  Hazem Kenner J [place], MD

## 2021-01-23 NOTE — Patient Instructions (Signed)
  Medication Instructions:  Your physician recommends that you continue on your current medications as directed. Please refer to the Current Medication list given to you today. --If you need a refill on any your medications before your next appointment, please call your pharmacy first. If no refills are authorized on file call the office.--  Follow-Up: Your next appointment:   Your physician recommends that you schedule a follow-up appointment in: 1 MONTH with Dr. de Peru  Thanks for letting us be apart of your health journey!!  Primary Care and Sports Medicine   Dr. de Peru and Shawna Clamp, DNP, AGNP  We recommend signing up for the patient portal called "MyChart".  Sign up information is provided on this After Visit Summary.  MyChart is used to connect with patients for Virtual Visits (Telemedicine).  Patients are able to view lab/test results, encounter notes, upcoming appointments, etc.  Non-urgent messages can be sent to your provider as well.   To learn more about what you can do with MyChart, please visit --  [email or web address].

## 2021-01-23 NOTE — Assessment & Plan Note (Signed)
Likely related to recurrent injuries in the past, location of pain is over ATFL, consistent with history of recurrent ankle sprains; location of medial sided pain suggest posterior tibial tendinitis as underlying cause for medial pain Discussed etiology, treatment options Provided handout with ankle exercises, patient indicates that he does have elastic band at home to use for exercise Discussed appropriate shoewear for arch support in setting of suspected posterior tibial tendinitis Consider referral to physical therapy, patient wishes to hold off on this for now

## 2021-01-23 NOTE — Assessment & Plan Note (Signed)
Continue with atorvastatin at this time, refill provided Plan to check labs at upcoming appointment

## 2021-01-23 NOTE — Assessment & Plan Note (Signed)
Encourage lifestyle modifications including dietary changes, regular weekly moderate intensity aerobic activity Monitor weight at future visits

## 2021-01-23 NOTE — Assessment & Plan Note (Signed)
Continue with amlodipine and metoprolol at this time, refills provided Continue with intermittent checks of blood pressure at home Recommend lifestyle modifications as well including DASH diet, regular physical activity Plan to check labs at next visit

## 2021-02-20 ENCOUNTER — Other Ambulatory Visit: Payer: Self-pay

## 2021-02-20 ENCOUNTER — Encounter (HOSPITAL_BASED_OUTPATIENT_CLINIC_OR_DEPARTMENT_OTHER): Payer: Self-pay | Admitting: Family Medicine

## 2021-02-20 ENCOUNTER — Ambulatory Visit (INDEPENDENT_AMBULATORY_CARE_PROVIDER_SITE_OTHER): Admitting: Family Medicine

## 2021-02-20 VITALS — BP 144/84 | HR 63 | Ht 72.0 in | Wt 252.8 lb

## 2021-02-20 DIAGNOSIS — Z0000 Encounter for general adult medical examination without abnormal findings: Secondary | ICD-10-CM | POA: Insufficient documentation

## 2021-02-20 DIAGNOSIS — Z23 Encounter for immunization: Secondary | ICD-10-CM

## 2021-02-20 DIAGNOSIS — E669 Obesity, unspecified: Secondary | ICD-10-CM

## 2021-02-20 DIAGNOSIS — E785 Hyperlipidemia, unspecified: Secondary | ICD-10-CM | POA: Diagnosis not present

## 2021-02-20 DIAGNOSIS — I10 Essential (primary) hypertension: Secondary | ICD-10-CM

## 2021-02-20 DIAGNOSIS — Z1211 Encounter for screening for malignant neoplasm of colon: Secondary | ICD-10-CM

## 2021-02-20 MED ORDER — SHINGRIX 50 MCG/0.5ML IM SUSR: 0.5000 mL | Freq: Once | INTRAMUSCULAR | 0 refills | Status: DC

## 2021-02-20 MED ORDER — SHINGRIX 50 MCG/0.5ML IM SUSR: 0.5000 mL | Freq: Once | INTRAMUSCULAR | 0 refills | Status: AC

## 2021-02-20 NOTE — Assessment & Plan Note (Signed)
Slightly above goal in office today Continue current medications Check labs as below

## 2021-02-20 NOTE — Progress Notes (Signed)
Established Patient Office Visit  Subjective:  Patient ID: Keith French, male    DOB: 1971/03/27  Age: 50 y.o. MRN: [id number]  CC:  Chief Complaint  Patient presents with   Annual Exam   Ankle Pain    Patient presents today with complaints of right ankle pain x 1.5 years. He states he injured it about a year ago and was seen by [facility] where they did xrays and told him he had an old injury and bone spurs. He feels like it is not healing and it still causes discomfort   foot pain    Patient presents today with complaints of left foot pain x several weeks. 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 He states the bone appears to be "sticking out" on the side    HPI Keith French is a 50 year old male presenting for CPE.  Patient with past medical history of hypertension, dyslipidemia, chronic right ankle pain.  Has been sometime since last lab work It has been a while since his last dental visit, knows that he needs to go Had a vision exam about 2 years ago, does wear glasses at times Family history of prostate cancer in his father who he thinks was diagnosed at age 39, some questions about screening Has not had colonoscopy completed Thinks it has been more than 10 years since last tetanus Interested in shingles vaccine No recent changes in medications or new medical problems  Past Medical History:  Diagnosis Date   Back pain    High cholesterol    Hypertension     No past surgical history on file.  Family History  Problem Relation Age of Onset   Hypertension Mother    Prostate cancer Father 58   Heart disease Maternal Grandmother     Social History   Socioeconomic History   Marital status: Married    Spouse name: Not on file   Number of children: Not on file   Years of education: Not on file   Highest education level: Not on file  Occupational History   Not on file  Tobacco Use    Smoking status: Never   Smokeless tobacco: Never  Substance and Sexual Activity   Alcohol use: Yes    Comment: socially   Drug use: No   Sexual activity: Not on file  Other Topics Concern   Not on file  Social History Narrative   Not on file   Social Determinants of Health   Financial Resource Strain: Not on file  Food Insecurity: Not on file  Transportation Needs: Not on file  Physical Activity: Not on file  Stress: Not on file  Social Connections: Not on file  Intimate Partner Violence: Not on file    Outpatient Medications Prior to Visit  Medication Sig Dispense Refill   amLODipine (NORVASC) 10 MG tablet Take 1 tablet (10 mg total) by mouth daily. 90 tablet 1   atorvastatin (LIPITOR) 10 MG tablet Take 1 tablet (10 mg total) by mouth daily. 90 tablet 1   metoprolol succinate (TOPROL-XL) 50 MG 24 hr tablet Take 1 tablet (50 mg total) by mouth daily. 90 tablet 1   No facility-administered medications prior to visit.    Allergies  Allergen Reactions   Penicillins Other (See Comments)    unknown      Objective:    Physical Exam Constitutional:      General:  He is not in acute distress.    Appearance: Normal appearance. He is obese.  HENT:     Head: Normocephalic and atraumatic.     Right Ear: Tympanic membrane normal.     Left Ear: Tympanic membrane normal.     Nose: Nose normal.     Mouth/Throat:     Mouth: Mucous membranes are moist.     Pharynx: Oropharynx is clear.  Eyes:     General: No scleral icterus.    Extraocular Movements: Extraocular movements intact.     Conjunctiva/sclera: Conjunctivae normal.     Pupils: Pupils are equal, round, and reactive to light.  Cardiovascular:     Rate and Rhythm: Normal rate and regular rhythm.     Pulses: Normal pulses.     Heart sounds: No murmur heard. Pulmonary:     Effort: Pulmonary effort is normal.     Breath sounds: Normal breath sounds. No wheezing.  Abdominal:     General: Bowel sounds are normal. There  is no distension.     Palpations: Abdomen is soft.  Musculoskeletal:     Cervical back: Neck supple. No tenderness.  Lymphadenopathy:     Cervical: No cervical adenopathy.  Skin:    General: Skin is warm and dry.     Coloration: Skin is not jaundiced.  Neurological:     General: No focal deficit present.     Mental Status: He is alert and oriented to person, place, and time.     Cranial Nerves: No cranial nerve deficit.     Gait: Gait normal.  Psychiatric:        Mood and Affect: Mood normal.        Behavior: Behavior normal.        Thought Content: Thought content normal.    BP (!) 144/84   Pulse 63   Ht 6' (1.829 m)   Wt 252 lb 12.8 oz (114.7 kg)   SpO2 98%   BMI 34.29 kg/m  Wt Readings from Last 3 Encounters:  02/20/21 252 lb 12.8 oz (114.7 kg)  01/23/21 250 lb 3.2 oz (113.5 kg)  12/24/19 245 lb (111.1 kg)     Health Maintenance Due  Topic Date Due   COVID-19 Vaccine (1) Never done   HIV Screening  Never done   Hepatitis C Screening  Never done   COLONOSCOPY (Pts 45-53yr Insurance coverage will need to be confirmed)  Never done   Zoster Vaccines- Shingrix (1 of 2) Never done    There are no preventive care reminders to display for this patient.  No results found for: TSH No results found for: WBC, HGB, HCT, MCV, PLT No results found for: NA, K, CHLORIDE, CO2, GLUCOSE, BUN, CREATININE, BILITOT, ALKPHOS, AST, ALT, PROT, ALBUMIN, CALCIUM, ANIONGAP, EGFR, GFR No results found for: CHOL No results found for: HDL No results found for: LDLCALC No results found for: TRIG No results found for: CHOLHDL No results found for: HGBA1C    Assessment & Plan:   Problem List Items Addressed This Visit       Cardiovascular and Mediastinum   Hypertension     Other   Dyslipidemia   Obesity (BMI 30.0-34.9)   Physical exam, annual   Other Visit Diagnoses     Encounter for immunization    -  Primary   Relevant Medications   Zoster Vaccine Adjuvanted ([facility]  injection   Other Relevant Orders   Td : Tetanus/diphtheria >7yo Preservative  free (Completed)  Meds ordered this encounter  Medications   DISCONTD: Zoster Vaccine Adjuvanted [facility]) injection    Sig: Inject 0.5 mLs into the muscle once for 1 dose.    Dispense:  0.5 mL    Refill:  0   Zoster Vaccine Adjuvanted [facility]) injection    Sig: Inject 0.5 mLs into the muscle once for 1 dose.    Dispense:  0.5 mL    Refill:  0    Follow-up: No follow-ups on file.    Esmirna Ravan J [place], MD

## 2021-02-20 NOTE — Addendum Note (Signed)
Addended by: Dareen Piano on: 02/20/2021 04:13 PM   Modules accepted: Orders

## 2021-02-20 NOTE — Assessment & Plan Note (Signed)
Encourage lifestyle modifications, working towards gradual weight loss Check labs as below

## 2021-02-20 NOTE — Assessment & Plan Note (Signed)
Continue with atorvastatin Check labs as below

## 2021-02-20 NOTE — Patient Instructions (Signed)
**Note Keith-Identified via Obfuscation**   Medication Instructions:  Your physician recommends that you continue on your current medications as directed. Please refer to the Current Medication list given to you today. --If you need a refill on any your medications before your next appointment, please call your pharmacy first. If no refills are authorized on file call the office.--  Lab Work: Your physician has recommended that you have lab work today: CBC, CMET, Lipid Profile, HbG A1C, and PSA If you have labs (blood work) drawn today and your tests are completely normal, you will receive your results via MyChart message OR a phone call from our staff.  Please ensure you check your voicemail in the event that you authorized detailed messages to be left on a delegated number. If you have any lab test that is abnormal or we need to change your treatment, we will call you to review the results.  Referrals/Procedures/Imaging: Your physician recommends that you have a Colonoscopy. A colonoscopy s an exam used to look for changes -- such as swollen, irritated tissues, polyps or cancer -- in the large intestine (colon) and rectum. During a colonoscopy, a long, flexible tube (colonoscope) is inserted into the rectum.  You may have this completed at [facility] [address] , [place], [place] [address] [phone]  Follow-Up: Your next appointment:   Your physician recommends that you schedule a follow-up appointment in: 4-6 MONTHS with Dr. de Keith French  Thanks for letting us be apart of your health journey!!  Primary Care and Sports Medicine   Dr. Ceasar Mons Keith French   We encourage you to activate your patient portal called "MyChart".  Sign up information is provided on this After Visit Summary.  MyChart is used to connect with patients for Virtual Visits (Telemedicine).  Patients are able to view lab/test results, encounter notes, upcoming appointments, etc.  Non-urgent messages can be sent to  your provider as well. To learn more about what you can do with MyChart, please visit --  [email or web address].

## 2021-02-20 NOTE — Assessment & Plan Note (Signed)
Overall, well-appearing male Discussed health maintenance issues and anticipatory guidance Recommend tetanus vaccine today Recommend getting Shingrix, prescription provided for patient to obtain at pharmacy Check labs as below Recommend regular dental screening visits, patient understands, plans to schedule Referral placed for colonoscopy for colon cancer screening The natural history of prostate cancer and ongoing controversy regarding screening and potential treatment outcomes of prostate cancer has been discussed with the patient. The meaning of a false positive PSA and a false negative PSA has been discussed. He indicates understanding of the limitations of this screening test and wishes to proceed with screening PSA testing

## 2021-02-21 LAB — CBC WITH DIFFERENTIAL/PLATELET
Basophils Absolute: 0 10*3/uL (ref 0.0–0.2)
Basos: 0 %
EOS (ABSOLUTE): 0.2 10*3/uL (ref 0.0–0.4)
Eos: 3 %
Hematocrit: 44.9 % (ref 37.5–51.0)
Hemoglobin: 14.5 g/dL (ref 13.0–17.7)
Immature Grans (Abs): 0 10*3/uL (ref 0.0–0.1)
Immature Granulocytes: 0 %
Lymphocytes Absolute: 2.4 10*3/uL (ref 0.7–3.1)
Lymphs: 30 %
MCH: 26.5 pg — ABNORMAL LOW (ref 26.6–33.0)
MCHC: 32.3 g/dL (ref 31.5–35.7)
MCV: 82 fL (ref 79–97)
Monocytes Absolute: 0.6 10*3/uL (ref 0.1–0.9)
Monocytes: 7 %
Neutrophils Absolute: 4.8 10*3/uL (ref 1.4–7.0)
Neutrophils: 60 %
Platelets: 324 10*3/uL (ref 150–450)
RBC: 5.47 x10E6/uL (ref 4.14–5.80)
RDW: 14.3 % (ref 11.6–15.4)
WBC: 8 10*3/uL (ref 3.4–10.8)

## 2021-02-21 LAB — COMPREHENSIVE METABOLIC PANEL
ALT: 55 IU/L — ABNORMAL HIGH (ref 0–44)
AST: 34 IU/L (ref 0–40)
Albumin/Globulin Ratio: 1.3 (ref 1.2–2.2)
Albumin: 4.4 g/dL (ref 4.0–5.0)
Alkaline Phosphatase: 106 IU/L (ref 44–121)
BUN/Creatinine Ratio: 9 (ref 9–20)
BUN: 14 mg/dL (ref 6–24)
Bilirubin Total: 0.7 mg/dL (ref 0.0–1.2)
CO2: 22 mmol/L (ref 20–29)
Calcium: 10 mg/dL (ref 8.7–10.2)
Chloride: 102 mmol/L (ref 96–106)
Creatinine, Ser: 1.56 mg/dL — ABNORMAL HIGH (ref 0.76–1.27)
Globulin, Total: 3.5 g/dL (ref 1.5–4.5)
Glucose: 101 mg/dL — ABNORMAL HIGH (ref 65–99)
Potassium: 5 mmol/L (ref 3.5–5.2)
Sodium: 139 mmol/L (ref 134–144)
Total Protein: 7.9 g/dL (ref 6.0–8.5)
eGFR: 54 mL/min/{1.73_m2} — ABNORMAL LOW (ref >59)

## 2021-02-21 LAB — PSA TOTAL (REFLEX TO FREE): Prostate Specific Ag, Serum: 1.1 ng/mL (ref 0.0–4.0)

## 2021-02-21 LAB — LIPID PANEL
Chol/HDL Ratio: 4.4 ratio (ref 0.0–5.0)
Cholesterol, Total: 182 mg/dL (ref 100–199)
HDL: 41 mg/dL (ref >39)
LDL Chol Calc (NIH): 119 mg/dL — ABNORMAL HIGH (ref 0–99)
Triglycerides: 122 mg/dL (ref 0–149)
VLDL Cholesterol Cal: 22 mg/dL (ref 5–40)

## 2021-02-21 LAB — HEMOGLOBIN A1C
Est. average glucose Bld gHb Est-mCnc: 123 mg/dL
Hgb A1c MFr Bld: 5.9 % — ABNORMAL HIGH (ref 4.8–5.6)

## 2021-03-11 ENCOUNTER — Telehealth (INDEPENDENT_AMBULATORY_CARE_PROVIDER_SITE_OTHER)

## 2021-03-11 DIAGNOSIS — R7989 Other specified abnormal findings of blood chemistry: Secondary | ICD-10-CM

## 2021-03-11 NOTE — Telephone Encounter (Signed)
-----   Message from Hosie Poisson Peru, MD sent at 03/06/2021  4:51 PM EDT ----- Attempted to call patient on mobile phone number listed - no answer. White blood cell and red blood cell counts are normal with normal hemoglobin.  Electrolytes are normal.  The prostate specific antigen level was within normal range.  Liver enzymes slightly elevated.  Slightly impaired kidney function on labs.  Slightly elevated "bad" cholesterol.  Hemoglobin A1c which measures average blood sugars over the past 3 months is within "prediabetes" range.  This indicates abnormal blood sugars but without diagnosis of diabetes.  This corresponds to an increased risk of developing diabetes in the future.  Initial recommendations is related to lifestyle modifications including dietary changes as well as working to gradually increase weekly physical activity, specifically with incorporation of moderate intensity aerobic exercise. Given impaired kidney function, recommend further evaluation of this including urinalysis, urine microalbumin/creatinine ratio, renal ultrasound.

## 2021-03-11 NOTE — Telephone Encounter (Signed)
Patient is aware and agreeable to lab results and recommendations Patient is agreeable to coming to the office for follow up testing (urine microalbumin and UA)  Patient is agreeable to having renal ultrasound at [facility] - contact information given to patient to call and schedule

## 2021-03-14 ENCOUNTER — Other Ambulatory Visit: Payer: Self-pay

## 2021-03-14 ENCOUNTER — Ambulatory Visit (HOSPITAL_BASED_OUTPATIENT_CLINIC_OR_DEPARTMENT_OTHER)
Admission: RE | Admit: 2021-03-14 | Discharge: 2021-03-14 | Disposition: A | Discharge status: Home / Self Care | Source: Ambulatory Visit | Attending: Family Medicine | Admitting: Family Medicine

## 2021-03-14 DIAGNOSIS — R7989 Other specified abnormal findings of blood chemistry: Secondary | ICD-10-CM | POA: Insufficient documentation

## 2021-03-18 LAB — POCT URINALYSIS DIPSTICK
Bilirubin, UA: NEGATIVE
Blood, UA: NEGATIVE
Glucose, UA: NEGATIVE
Ketones, UA: NEGATIVE
Leukocytes, UA: NEGATIVE
Nitrite, UA: NEGATIVE
Protein, UA: POSITIVE — AB
Spec Grav, UA: 1.015 (ref 1.010–1.025)
Urobilinogen, UA: 0.2 E.U./dL
pH, UA: 5.5 (ref 5.0–8.0)

## 2021-03-18 LAB — POCT UA - MICROALBUMIN
Creatinine, POC: 300 mg/dL
Microalbumin Ur, POC: 150 mg/L

## 2021-03-18 NOTE — Addendum Note (Signed)
Addended by: Dareen Piano on: 03/18/2021 09:23 AM   Modules accepted: Orders

## 2021-03-25 ENCOUNTER — Other Ambulatory Visit

## 2021-03-27 ENCOUNTER — Telehealth (HOSPITAL_BASED_OUTPATIENT_CLINIC_OR_DEPARTMENT_OTHER): Payer: Self-pay

## 2021-03-27 NOTE — Telephone Encounter (Signed)
-----   Message from Hosie Poisson [place], MD sent at 03/21/2021 12:17 PM EDT ----- Labs reveal small amount of protein in the urine.  Given findings of recent labs as well as ultrasound the kidneys, would plan to repeat labs at time of next office visit in a few months to monitor for changes.  Labs at that time to include BMP and microalbumin/creatinine ratio.

## 2021-03-27 NOTE — Telephone Encounter (Signed)
Patient is aware and agreeable to lab results and recommendations Patient is agreeable to repeating labs at next office visit in Dec.

## 2021-05-27 ENCOUNTER — Encounter: Payer: Self-pay | Admitting: Emergency Medicine

## 2021-05-27 ENCOUNTER — Other Ambulatory Visit: Payer: Self-pay

## 2021-05-27 ENCOUNTER — Ambulatory Visit
Admission: EM | Admit: 2021-05-27 | Discharge: 2021-05-27 | Disposition: A | Discharge status: Home / Self Care | Attending: Emergency Medicine | Admitting: Emergency Medicine

## 2021-05-27 DIAGNOSIS — J069 Acute upper respiratory infection, unspecified: Secondary | ICD-10-CM | POA: Diagnosis not present

## 2021-05-27 DIAGNOSIS — Z1152 Encounter for screening for COVID-19: Secondary | ICD-10-CM | POA: Diagnosis not present

## 2021-05-27 DIAGNOSIS — J101 Influenza due to other identified influenza virus with other respiratory manifestations: Secondary | ICD-10-CM

## 2021-05-27 MED ORDER — IBUPROFEN 600 MG PO TABS: 600.0000 mg | ORAL_TABLET | Freq: Four times a day (QID) | ORAL | 0 refills | Status: DC | PRN

## 2021-05-27 MED ORDER — FLUTICASONE PROPIONATE 50 MCG/ACT NA SUSP: 2.0000 | Freq: Every day | NASAL | 0 refills | Status: DC

## 2021-05-27 MED ORDER — BENZONATATE 200 MG PO CAPS: 200.0000 mg | ORAL_CAPSULE | Freq: Three times a day (TID) | ORAL | 0 refills | Status: DC | PRN

## 2021-05-27 NOTE — ED Triage Notes (Signed)
Pt c/o cough, HA, chills, congestion x 3 days

## 2021-05-27 NOTE — ED Provider Notes (Addendum)
HPI  SUBJECTIVE:  Keith French is a 50 y.o. male who presents with 3 days of body aches, 2 episodes of diarrhea, headache, cough.  He states that he has felt feverish, but has not checked.  He reports nasal congestion, rhinorrhea, sore throat that has resolved.  His son and wife have identical symptoms currently.  No loss of sense of smell or taste, wheezing, shortness of breath, nausea, vomiting, abdominal pain.  No known COVID exposure.  He got the COVID booster.  No antibiotics in the past month.  No antipyretic in the past 6 hours.  He is unable to sleep at night secondary to the nasal/head congestion.  He has tried Mucinex and Tylenol with improvement in his symptoms.  No aggravating factors.  He has a past medical history of hypertension, hypercholesterolemia, epistaxis.  PMD: Cannot remember.  Past Medical History:  Diagnosis Date   Back pain    High cholesterol    Hypertension     History reviewed. No pertinent surgical history.  Family History  Problem Relation Age of Onset   Hypertension Mother    Prostate cancer Father 85   Heart disease Maternal Grandmother     Social History   Tobacco Use   Smoking status: Never   Smokeless tobacco: Never  Vaping Use   Vaping Use: Never used  Substance Use Topics   Alcohol use: Yes    Comment: socially   Drug use: No    No current facility-administered medications for this encounter.  Current Outpatient Medications:    amLODipine (NORVASC) 10 MG tablet, Take 1 tablet (10 mg total) by mouth daily., Disp: 90 tablet, Rfl: 1   atorvastatin (LIPITOR) 10 MG tablet, Take 1 tablet (10 mg total) by mouth daily., Disp: 90 tablet, Rfl: 1   benzonatate (TESSALON) 200 MG capsule, Take 1 capsule (200 mg total) by mouth 3 (three) times daily as needed for cough., Disp: 30 capsule, Rfl: 0   fluticasone (FLONASE) 50 MCG/ACT nasal spray, Place 2 sprays into both nostrils daily., Disp: 16 g, Rfl: 0   ibuprofen (ADVIL) 600 MG tablet, Take 1  tablet (600 mg total) by mouth every 6 (six) hours as needed., Disp: 30 tablet, Rfl: 0   metoprolol succinate (TOPROL-XL) 50 MG 24 hr tablet, Take 1 tablet (50 mg total) by mouth daily., Disp: 90 tablet, Rfl: 1  Allergies  Allergen Reactions   Penicillins Other (See Comments)    unknown     ROS  As noted in HPI.   Physical Exam  BP (!) 141/87 (BP Location: Right Arm)   Pulse 67   Temp 99 F (37.2 C) (Oral)   Resp 18   SpO2 96%   Constitutional: Well developed, well nourished, no acute distress Eyes:  EOMI, conjunctiva normal bilaterally HENT: Normocephalic, atraumatic,mucus membranes moist.  Erythematous, swollen turbinates.  Positive clear nasal congestion.  Friable nasal mucosa left side.  No active epistaxis.  No maxillary, frontal sinus tenderness.  Normal tonsils without exudates.  No obvious postnasal drip. Neck: No cervical adenopathy Respiratory: Normal inspiratory effort, lungs clear bilaterally Cardiovascular: Normal rate regular rhythm, no murmurs rubs or gallops GI: nondistended skin: No rash, skin intact Musculoskeletal: no deformities Neurologic: Alert & oriented x 3, no focal neuro deficits Psychiatric: Speech and behavior appropriate   ED Course   Medications - No data to display  Orders Placed This Encounter  Procedures   Coronavirus (COVID-19) with Influenza A and Influenza B    Standing Status:  Standing    Number of Occurrences:   1   Results for orders placed or performed during the hospital encounter of 05/27/21  Coronavirus (COVID-19) with Influenza A and Influenza B   Specimen: Nasopharyngeal   Naso  Result Value Ref Range   SARS-CoV-2, NAA Not Detected Not Detected   Influenza A, NAA Detected (A) Not Detected   Influenza B, NAA Not Detected Not Detected   Test Information: Comment     No results found for this or any previous visit (from the past 24 hour(s)). No results found.  ED Clinical Impression  1. Influenza A   2.  Encounter for screening for COVID-19   3. Acute upper respiratory infection      ED Assessment/Plan  COVID, flu sent  COVID, flu sent.  He is out of the window for antiviral treatment if his flu is positive, however, he may still be within the treatment window if his COVID comes back positive.  Will prescribe Molnupiravir if his COVID is positive based on his comorbidities and BMI above 30.  In the meantime, Flonase, Tessalon, continue Mucinex, will start him on ibuprofen/Tylenol, start saline nasal irrigation.  Follow-up with PMD as needed.  COVID work/school note.  Patient flu a positive.  COVID is negative.  He is out of the window for influenza treatment.  Supportive treatment.  Discussed labs,  MDM, treatment plan, and plan for follow-up with patient. patient agrees with plan.   Meds ordered this encounter  Medications   fluticasone (FLONASE) 50 MCG/ACT nasal spray    Sig: Place 2 sprays into both nostrils daily.    Dispense:  16 g    Refill:  0   benzonatate (TESSALON) 200 MG capsule    Sig: Take 1 capsule (200 mg total) by mouth 3 (three) times daily as needed for cough.    Dispense:  30 capsule    Refill:  0   ibuprofen (ADVIL) 600 MG tablet    Sig: Take 1 tablet (600 mg total) by mouth every 6 (six) hours as needed.    Dispense:  30 tablet    Refill:  0      *This clinic note was created using Scientist, clinical (histocompatibility and immunogenetics). Therefore, there may be occasional mistakes despite careful proofreading.  ?    Domenick Gong, MD 05/27/21 1605    Domenick Gong, MD 05/29/21 [phone]

## 2021-05-27 NOTE — Discharge Instructions (Addendum)
Continue Mucinex to keep the mucous thin.   You may take 600 mg of motrin with 1000 mg of tylenol up to 3-4 times a day as needed for pain. This is an effective combination for pain.  Flonase for the nasal congestion.  Stop if it makes her nosebleed.  Tessalon for the cough.  Use a NeilMed sinus rinse with distilled water as often as you want to to reduce nasal congestion. Follow the directions on the box.   Go to www.goodrx.com to look up your medications. This will give you a list of where you can find your prescriptions at the most affordable prices. Or you can ask the pharmacist what the cash price is. This is frequently cheaper than going through insurance.

## 2021-05-28 LAB — COVID-19, FLU A+B NAA
Influenza A, NAA: DETECTED — AB
Influenza B, NAA: NOT DETECTED
SARS-CoV-2, NAA: NOT DETECTED

## 2021-07-23 ENCOUNTER — Ambulatory Visit (HOSPITAL_BASED_OUTPATIENT_CLINIC_OR_DEPARTMENT_OTHER): Admitting: Family Medicine

## 2021-08-05 ENCOUNTER — Encounter (HOSPITAL_BASED_OUTPATIENT_CLINIC_OR_DEPARTMENT_OTHER): Payer: Self-pay | Admitting: Family Medicine

## 2021-08-05 ENCOUNTER — Ambulatory Visit (HOSPITAL_BASED_OUTPATIENT_CLINIC_OR_DEPARTMENT_OTHER): Admitting: Family Medicine

## 2021-08-05 ENCOUNTER — Other Ambulatory Visit: Payer: Self-pay

## 2021-08-05 VITALS — BP 154/86 | HR 68 | Ht 72.0 in | Wt 248.0 lb

## 2021-08-05 DIAGNOSIS — R7401 Elevation of levels of liver transaminase levels: Secondary | ICD-10-CM | POA: Diagnosis not present

## 2021-08-05 DIAGNOSIS — N1831 Chronic kidney disease, stage 3a: Secondary | ICD-10-CM

## 2021-08-05 DIAGNOSIS — N189 Chronic kidney disease, unspecified: Secondary | ICD-10-CM | POA: Insufficient documentation

## 2021-08-05 DIAGNOSIS — M25571 Pain in right ankle and joints of right foot: Secondary | ICD-10-CM | POA: Diagnosis not present

## 2021-08-05 DIAGNOSIS — I10 Essential (primary) hypertension: Secondary | ICD-10-CM | POA: Diagnosis not present

## 2021-08-05 DIAGNOSIS — L918 Other hypertrophic disorders of the skin: Secondary | ICD-10-CM

## 2021-08-05 DIAGNOSIS — G8929 Other chronic pain: Secondary | ICD-10-CM

## 2021-08-05 LAB — POCT UA - MICROALBUMIN
Creatinine, POC: 200 mg/dL
Microalbumin Ur, POC: 150 mg/L

## 2021-08-05 MED ORDER — LOSARTAN POTASSIUM 50 MG PO TABS: 50.0000 mg | ORAL_TABLET | Freq: Every day | ORAL | 1 refills | Status: DC

## 2021-08-05 MED ORDER — METOPROLOL SUCCINATE ER 25 MG PO TB24: ORAL_TABLET | ORAL | 0 refills | Status: DC

## 2021-08-05 NOTE — Assessment & Plan Note (Signed)
ALT slightly elevated on prior labs On renal ultrasound, portion of the liver was visualized with findings suggestive of hepatic steatosis We will repeat labs today to monitor liver enzymes, if continue to be elevated, consider evaluation with hepatitis panel, dedicated right upper quadrant ultrasound to evaluate liver

## 2021-08-05 NOTE — Assessment & Plan Note (Signed)
Blood pressure elevated in office today, patient reports that he has been checking at home occasionally and gets readings similar to that in office today Discussed options with patient, will continue with amlodipine, will add losartan Will gradually decrease and discontinue metoprolol as he indicates this was initially started for blood pressure, denies any history of palpitations or heart racing.  Given that efficacy of his medication however blood pressure is low, will discontinue Has associated elevated creatinine, suspect underlying CKD, repeating labs today

## 2021-08-05 NOTE — Assessment & Plan Note (Addendum)
Creatinine found to be elevated on prior labs, repeat BMP today as well as microalbumin/creatinine ratio due to moderately elevated albuminuria on prior labs Renal ultrasound pleated previously which was unremarkable Suspicion is that current findings may be related to longstanding hypertension If continued albuminuria, worsening of renal function, likely consider referral to nephrology for further evaluation and management

## 2021-08-05 NOTE — Assessment & Plan Note (Addendum)
Seen previously for this issue, pain likely related to history of right ankle injuries, posterior tibial tendinitis Discussed option again of referral to PT, could consider seeing foot and ankle specialist - would prefer to see specialist, referral placed today

## 2021-08-05 NOTE — Assessment & Plan Note (Signed)
Indicates that he has several skin tags that are inflamed and bothersome.  He is located under bilateral axilla, around both sides of the neck On exam, has several in above-mentioned areas, varying degrees of size.  Either tender/bothersome with signs of inflammation Will arrange for procedure visit to have these removed.  Can be scheduled at patient's earliest convenience

## 2021-08-05 NOTE — Patient Instructions (Addendum)
°  Medication Instructions:  Your physician has recommended you make the following change in your medication:  -- DECREASE METOPROLOL - Take 25 mg by mouth once daily for 1 week, then Take 25 mg every other day for 1 week. This will be the completion  -- START LOSARTAN - Take 50 mg by mouth once daily --If you need a refill on any your medications before your next appointment, please call your pharmacy first. If no refills are authorized on file call the office.-- Lab Work: Your physician has recommended that you have lab work today: [employer] fast streamer and CMP If you have labs (blood work) drawn today and your tests are completely normal, you will receive your results via MyChart message OR a phone call from our staff.  Please ensure you check your voicemail in the event that you authorized detailed messages to be left on a delegated number. If you have any lab test that is abnormal or we need to change your treatment, we will call you to review the results.  Referrals/Procedures/Imaging: A referral has been placed for you to [facility] for evaluation and treatment. Someone from the scheduling department will be in contact with you in regards to coordinating your consultation. If you do not hear from any of the schedulers within 7-10 business days please give their office a call.  [facility] [address] [place], [place] [address] Phone: [phone]  Follow-Up: Your next appointment:   Your physician recommends that you schedule a follow-up appointment in: 1-3 WEEKS with Dr. de [place]  You will receive a text message or e-mail with a link to a survey about your care and experience with [place] today! We would greatly appreciate your feedback!   Thanks for letting us be apart of your health journey!!  Primary Care and Sports Medicine   Dr. Ceasar Mons [place]   We encourage you to activate your patient portal called "MyChart".  Sign up information is provided  on this After Visit Summary.  MyChart is used to connect with patients for Virtual Visits (Telemedicine).  Patients are able to view lab/test results, encounter notes, upcoming appointments, etc.  Non-urgent messages can be sent to your provider as well. To learn more about what you can do with MyChart, please visit --  [email or web address].

## 2021-08-05 NOTE — Progress Notes (Signed)
° ° °  Procedures performed today:    None.  Independent interpretation of notes and tests performed by another provider:   None.  Brief History, Exam, Impression, and Recommendations:    BP (!) 154/86    Pulse 68    Ht 6' (1.829 m)    Wt 248 lb (112.5 kg)    SpO2 98%    BMI 33.63 kg/m   Chronic pain of right ankle Seen previously for this issue, pain likely related to history of right ankle injuries, posterior tibial tendinitis Discussed option again of referral to PT, could consider seeing foot and ankle specialist - would prefer to see specialist, referral placed today  Inflamed acrochordon Indicates that he has several skin tags that are inflamed and bothersome.  He is located under bilateral axilla, around both sides of the neck On exam, has several in above-mentioned areas, varying degrees of size.  Either tender/bothersome with signs of inflammation Will arrange for procedure visit to have these removed.  Can be scheduled at patient's earliest convenience  Hypertension Blood pressure elevated in office today, patient reports that he has been checking at home occasionally and gets readings similar to that in office today Discussed options with patient, will continue with amlodipine, will add losartan Will gradually decrease and discontinue metoprolol as he indicates this was initially started for blood pressure, denies any history of palpitations or heart racing.  Given that efficacy of his medication however blood pressure is low, will discontinue Has associated elevated creatinine, suspect underlying CKD, repeating labs today  Chronic kidney disease Creatinine found to be elevated on prior labs, repeat BMP today as well as microalbumin/creatinine ratio due to moderately elevated albuminuria on prior labs Renal ultrasound pleated previously which was unremarkable Suspicion is that current findings may be related to longstanding hypertension If continued albuminuria, worsening of  renal function, likely consider referral to nephrology for further evaluation and management  Transaminitis ALT slightly elevated on prior labs On renal ultrasound, portion of the liver was visualized with findings suggestive of hepatic steatosis We will repeat labs today to monitor liver enzymes, if continue to be elevated, consider evaluation with hepatitis panel, dedicated right upper quadrant ultrasound to evaluate liver  Spent 45 minutes on this patient encounter, including preparation, chart review, face-to-face counseling with patient and coordination of care, and documentation of encounter   ___________________________________________ Lakeria Starkman de [place], MD, ABFM, CAQSM Primary Care and Sports Medicine [facility]

## 2021-08-06 ENCOUNTER — Encounter (HOSPITAL_BASED_OUTPATIENT_CLINIC_OR_DEPARTMENT_OTHER): Payer: Self-pay | Admitting: Family Medicine

## 2021-08-06 LAB — COMPREHENSIVE METABOLIC PANEL
ALT: 31 IU/L (ref 0–44)
AST: 27 IU/L (ref 0–40)
Albumin/Globulin Ratio: 1.6 (ref 1.2–2.2)
Albumin: 4.4 g/dL (ref 4.0–5.0)
Alkaline Phosphatase: 85 IU/L (ref 44–121)
BUN/Creatinine Ratio: 12 (ref 9–20)
BUN: 19 mg/dL (ref 6–24)
Bilirubin Total: 0.4 mg/dL (ref 0.0–1.2)
CO2: 27 mmol/L (ref 20–29)
Calcium: 9.9 mg/dL (ref 8.7–10.2)
Chloride: 106 mmol/L (ref 96–106)
Creatinine, Ser: 1.57 mg/dL — ABNORMAL HIGH (ref 0.76–1.27)
Globulin, Total: 2.8 g/dL (ref 1.5–4.5)
Glucose: 90 mg/dL (ref 70–99)
Potassium: 5.1 mmol/L (ref 3.5–5.2)
Sodium: 142 mmol/L (ref 134–144)
Total Protein: 7.2 g/dL (ref 6.0–8.5)
eGFR: 53 mL/min/{1.73_m2} — ABNORMAL LOW (ref >59)

## 2021-08-06 LAB — HM COLONOSCOPY

## 2021-08-23 IMAGING — US US RENAL
1 series · 14 of 25 positions shown · non-contrast
Comparison: Ultrasound abdomen 04/26/2020

CLINICAL DATA: Elevated serum creatinine

Hypertension
EXAM:
RENAL / URINARY TRACT ULTRASOUND COMPLETE

[Series 1: us renal · 14 of 52 slices shown]
[im 1/52]
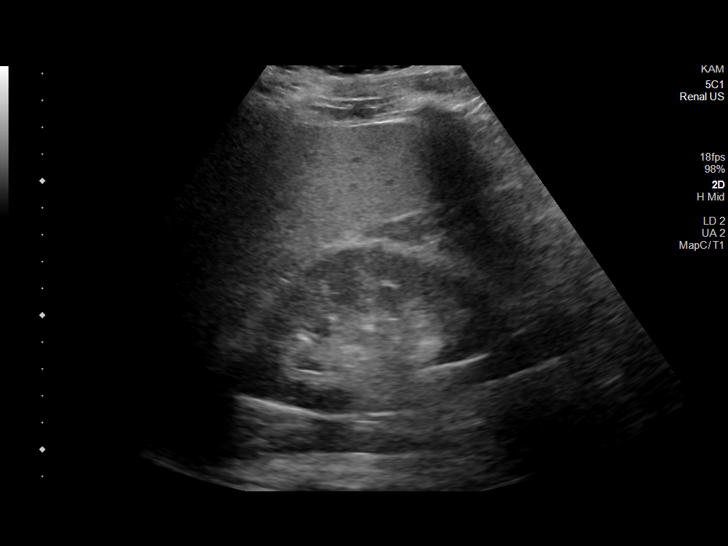
[im 5/52]
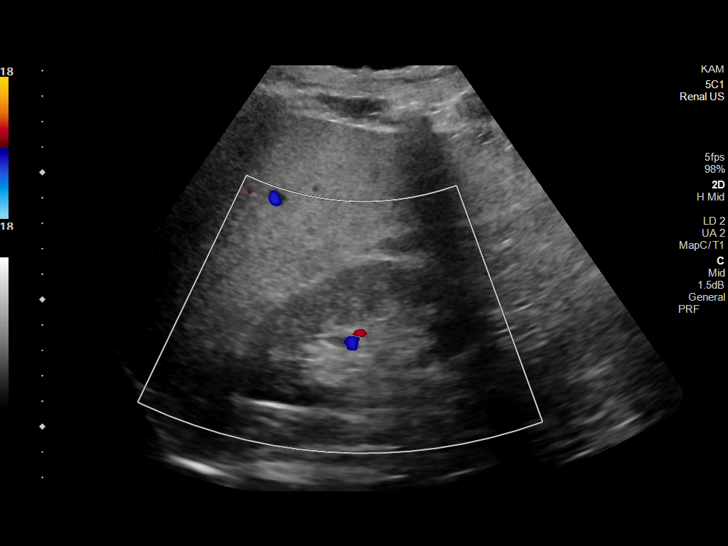
[im 9/52]
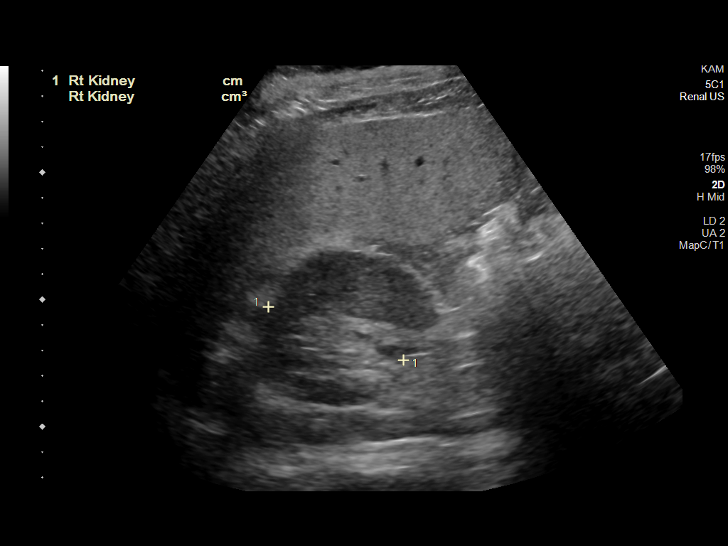
[im 13/52]
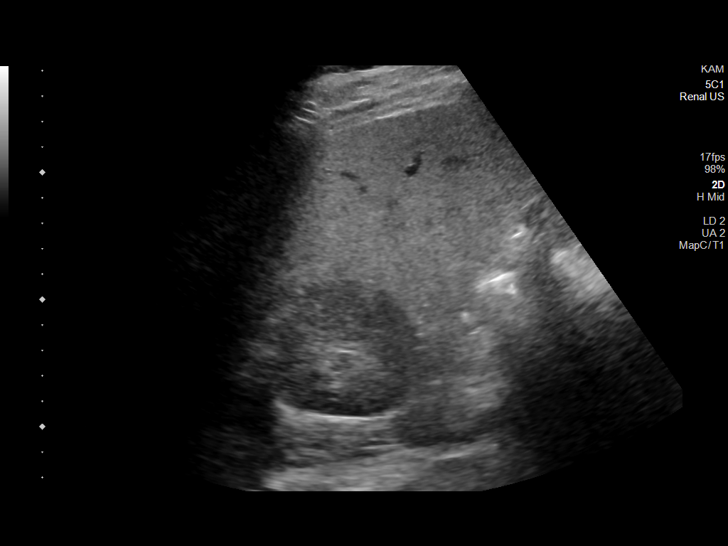
[im 18/52]
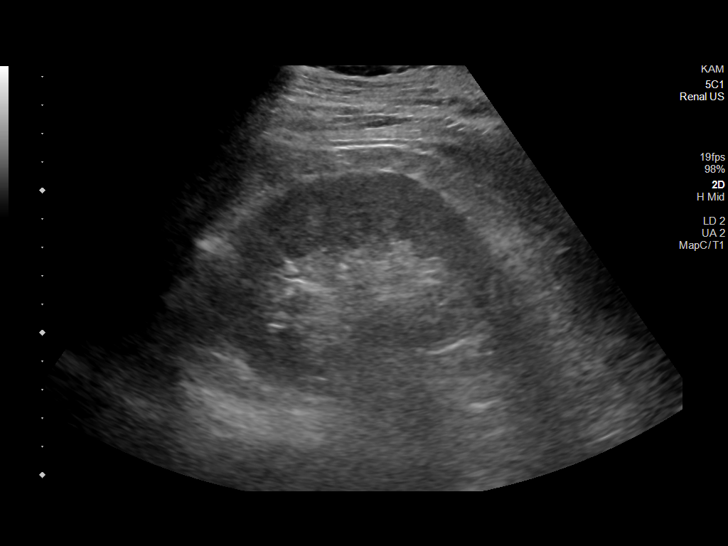
[im 20/52]
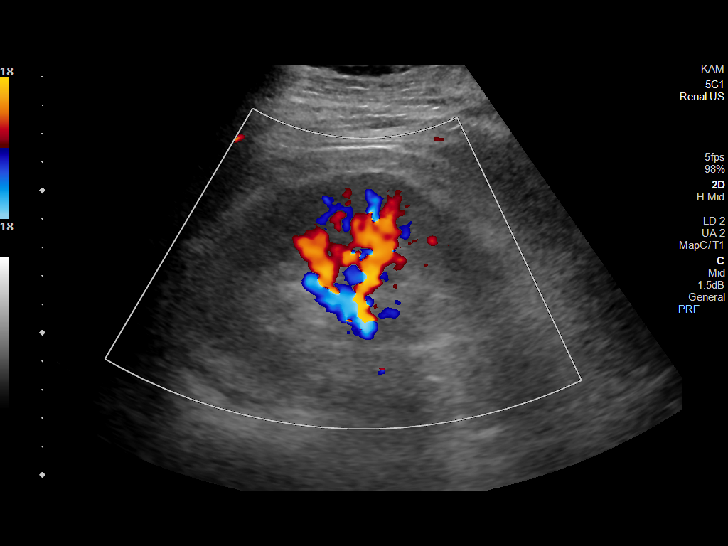
[im 24/52]
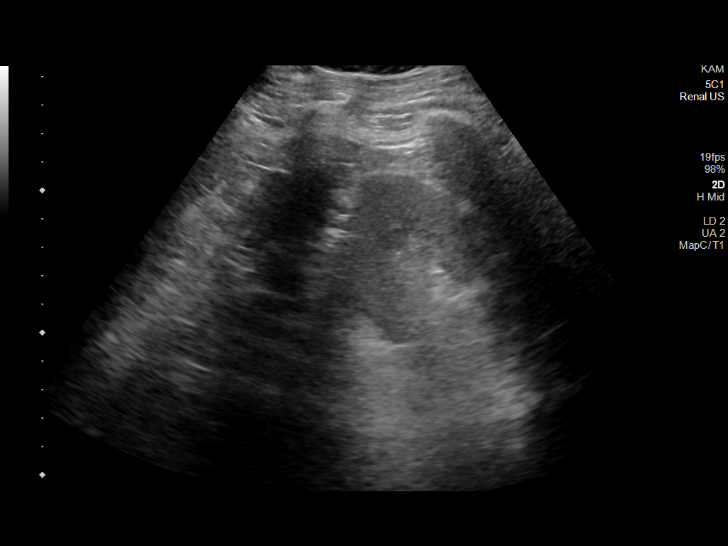
[im 28/52]
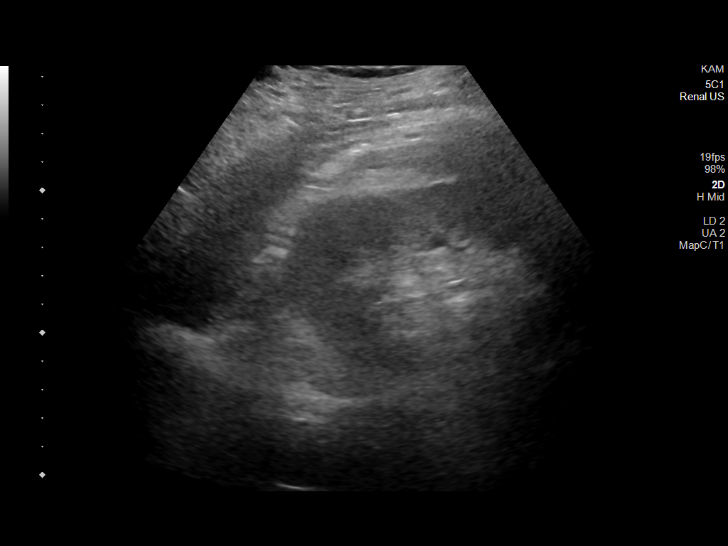
[im 32/52]
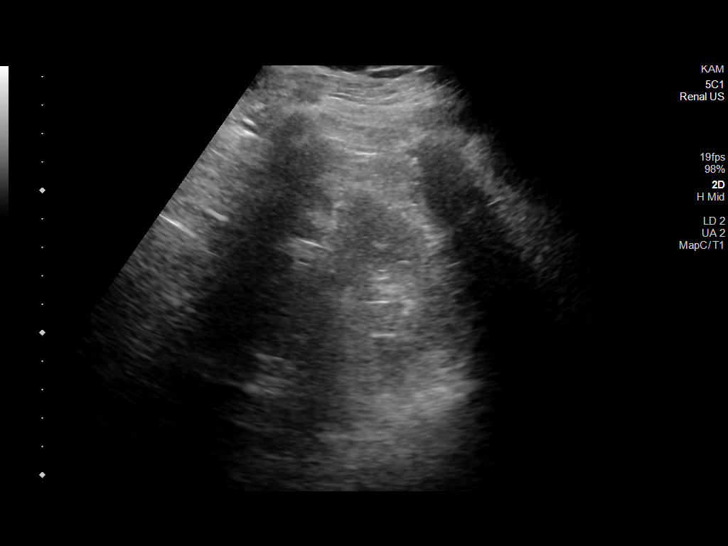
[im 35/52]
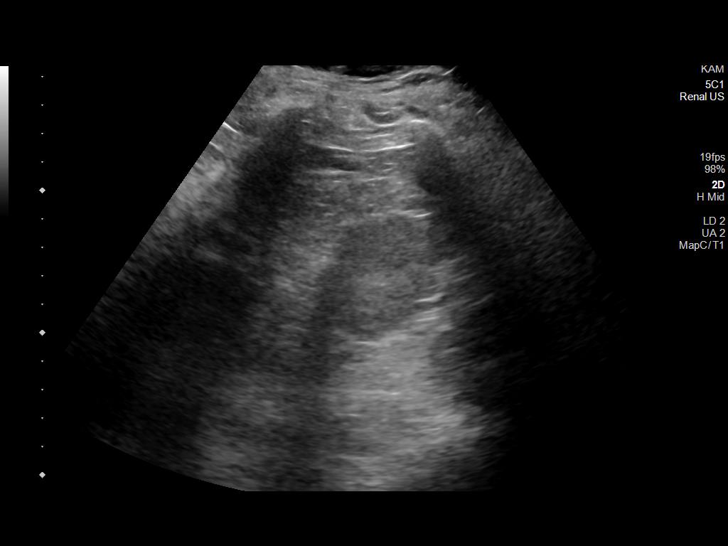
[im 39/52]
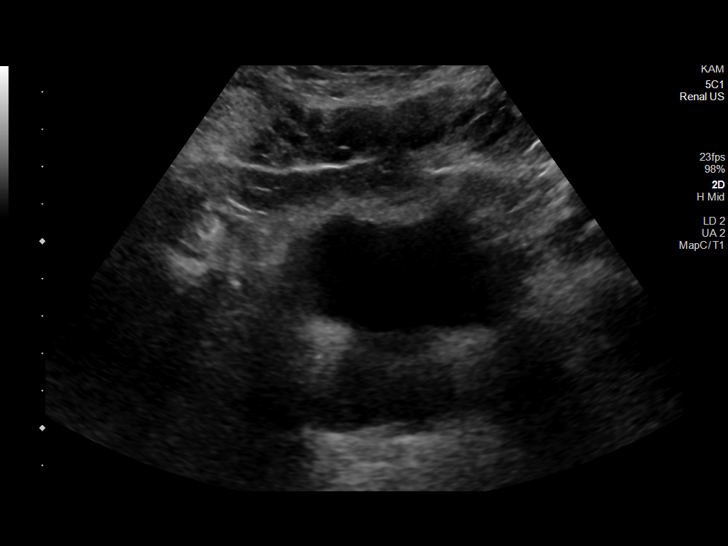
[im 43/52]
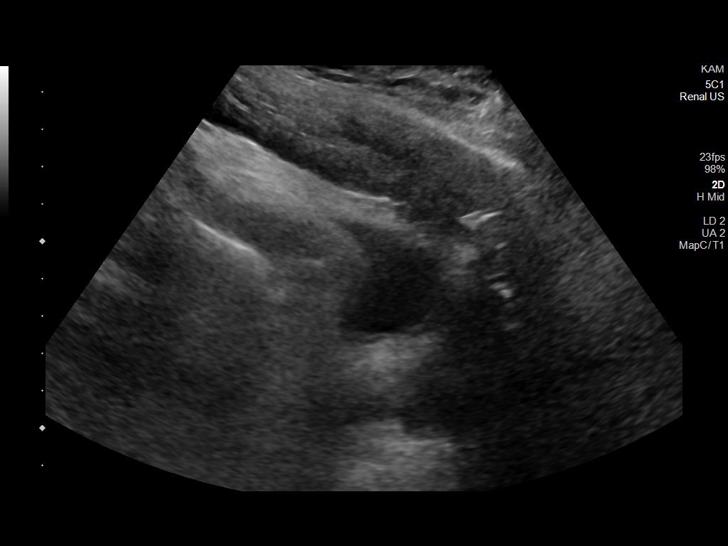
[im 47/52]
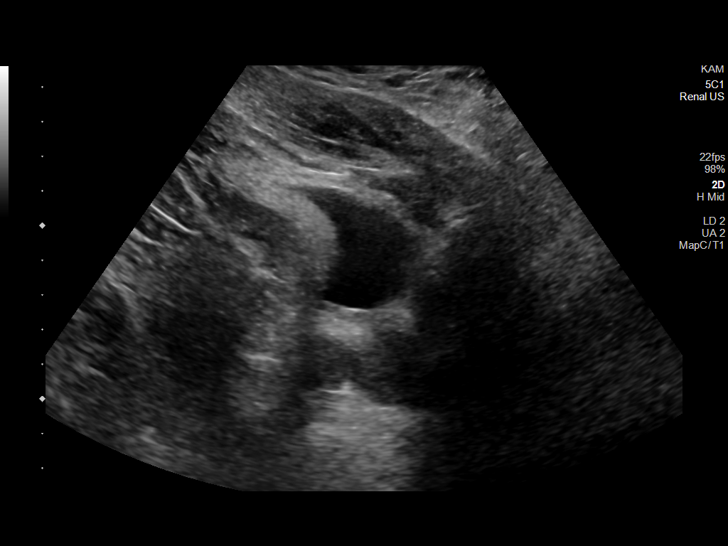
[im 52/52]
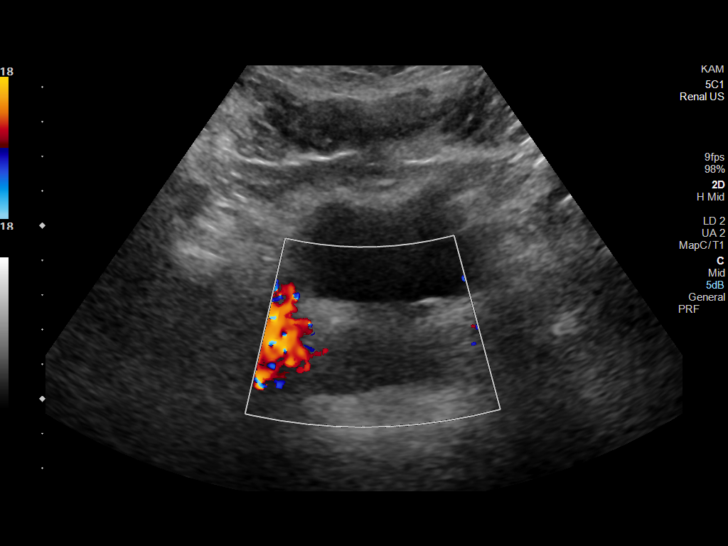

[14 of 25 positions shown; findings below may reference images not displayed]

FINDINGS: Right Kidney:

Renal measurements: 10.9 x 5.6 x 5.7 cm = volume: 182 mL.
Echogenicity within normal limits. No mass or hydronephrosis
visualized.

Left Kidney:

Renal measurements: 11.4 x 6.7 x 5.2 cm = volume: 207 mL.
Echogenicity within normal limits. No mass or hydronephrosis
visualized.

Bladder:

Appears normal for degree of bladder distention.

Other:

Diffuse increased echogenicity of the visualized portions of the
hepatic parenchyma are a nonspecific indicator of hepatocellular
dysfunction, most commonly steatosis.
IMPRESSION: No significant sonographic abnormality of the kidneys.

## 2021-08-29 ENCOUNTER — Ambulatory Visit (HOSPITAL_BASED_OUTPATIENT_CLINIC_OR_DEPARTMENT_OTHER): Admitting: Family Medicine

## 2021-09-02 ENCOUNTER — Other Ambulatory Visit: Payer: Self-pay

## 2021-09-02 ENCOUNTER — Ambulatory Visit (HOSPITAL_BASED_OUTPATIENT_CLINIC_OR_DEPARTMENT_OTHER): Admitting: Family Medicine

## 2021-09-02 ENCOUNTER — Encounter (HOSPITAL_BASED_OUTPATIENT_CLINIC_OR_DEPARTMENT_OTHER): Payer: Self-pay | Admitting: Family Medicine

## 2021-09-02 ENCOUNTER — Other Ambulatory Visit (HOSPITAL_BASED_OUTPATIENT_CLINIC_OR_DEPARTMENT_OTHER): Payer: Self-pay | Admitting: Family Medicine

## 2021-09-02 DIAGNOSIS — L918 Other hypertrophic disorders of the skin: Secondary | ICD-10-CM

## 2021-09-02 MED ORDER — AMLODIPINE BESYLATE 10 MG PO TABS: 10.0000 mg | ORAL_TABLET | Freq: Every day | ORAL | 1 refills | Status: DC

## 2021-09-02 NOTE — Assessment & Plan Note (Signed)
Presenting for follow-up of inflamed acrochordons Would like to proceed with skin tag removal as discussed at prior office visit Procedure note as above Skin tags removed without issue, no significant bleeding Instructed on proper postprocedure care, monitoring for signs of infection Plan for follow-up at next scheduled office visit or sooner as needed

## 2021-09-02 NOTE — Progress Notes (Signed)
° ° °  Procedures performed today:    Procedure: Inflamed skin tag removal Informed consent:  Discussed risks (permanent scarring, infection, pain, bleeding, bruising, redness, and recurrence of the lesion) and benefits of the procedure, as well as the alternatives.  He is aware that skin tags are benign lesions, and their removal is often not considered medically necessary.  Informed consent was obtained. Anesthesia: Lidocaine 1% with epinephrine, total of 1.5 mL used The area was prepared and draped in a standard fashion. Snip removal was performed.   Antibiotic ointment and a sterile dressing were applied.   The patient tolerated procedure well. The patient was instructed on post-op care.   Number of lesions removed:  6 - 1 in left axilla, 1 along left side of neck, 2 along right side of neck, 2 in right axilla.  Independent interpretation of notes and tests performed by another provider:   None.  Brief History, Exam, Impression, and Recommendations:    BP (!) 162/95    Pulse 84    Ht 6' (1.829 m)    Wt 251 lb (113.9 kg)    SpO2 100%    BMI 34.04 kg/m   Inflamed acrochordon Presenting for follow-up of inflamed acrochordons Would like to proceed with skin tag removal as discussed at prior office visit Procedure note as above Skin tags removed without issue, no significant bleeding Instructed on proper postprocedure care, monitoring for signs of infection Plan for follow-up at next scheduled office visit or sooner as needed   ___________________________________________ Opaline Reyburn de [place], MD, ABFM, CAQSM Primary Care and Sports Medicine [facility]

## 2021-09-22 ENCOUNTER — Ambulatory Visit: Payer: Self-pay | Admitting: Podiatry

## 2021-09-29 ENCOUNTER — Other Ambulatory Visit (HOSPITAL_BASED_OUTPATIENT_CLINIC_OR_DEPARTMENT_OTHER): Payer: Self-pay | Admitting: Family Medicine

## 2021-09-30 ENCOUNTER — Ambulatory Visit (HOSPITAL_BASED_OUTPATIENT_CLINIC_OR_DEPARTMENT_OTHER): Admitting: Family Medicine

## 2021-10-14 ENCOUNTER — Other Ambulatory Visit (HOSPITAL_BASED_OUTPATIENT_CLINIC_OR_DEPARTMENT_OTHER): Payer: Self-pay | Admitting: Family Medicine

## 2021-10-19 ENCOUNTER — Emergency Department
Admission: EM | Admit: 2021-10-19 | Discharge: 2021-10-19 | Disposition: A | Discharge status: Home / Self Care | Attending: Emergency Medicine | Admitting: Emergency Medicine

## 2021-10-19 ENCOUNTER — Encounter: Payer: Self-pay | Admitting: Emergency Medicine

## 2021-10-19 ENCOUNTER — Other Ambulatory Visit: Payer: Self-pay

## 2021-10-19 DIAGNOSIS — X088XXA Exposure to other specified smoke, fire and flames, initial encounter: Secondary | ICD-10-CM | POA: Insufficient documentation

## 2021-10-19 DIAGNOSIS — T23101A Burn of first degree of right hand, unspecified site, initial encounter: Secondary | ICD-10-CM | POA: Diagnosis present

## 2021-10-19 DIAGNOSIS — T300 Burn of unspecified body region, unspecified degree: Secondary | ICD-10-CM

## 2021-10-19 MED ORDER — SILVER SULFADIAZINE 1 % EX CREA: TOPICAL_CREAM | CUTANEOUS | 0 refills | Status: AC

## 2021-10-19 MED ORDER — NAPROXEN 500 MG PO TABS
500.0000 mg | ORAL_TABLET | Freq: Two times a day (BID) | ORAL | Status: DC
Start: 2021-10-19 — End: 2022-11-17

## 2021-10-19 MED ORDER — SILVER SULFADIAZINE 1 % EX CREA
TOPICAL_CREAM | Freq: Once | CUTANEOUS | Status: AC
  Filled 2021-10-19: qty 20

## 2021-10-19 MED ORDER — IBUPROFEN 800 MG PO TABS
800.0000 mg | ORAL_TABLET | Freq: Once | ORAL | Status: AC
  Administered 2021-10-19: 800 mg via ORAL
  Filled 2021-10-19: qty 1
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
## 2021-10-19 NOTE — Discharge Instructions (Signed)
Read and follow discharge care instructions. 

## 2022-01-07 ENCOUNTER — Other Ambulatory Visit (HOSPITAL_BASED_OUTPATIENT_CLINIC_OR_DEPARTMENT_OTHER): Payer: Self-pay | Admitting: Family Medicine

## 2022-01-21 ENCOUNTER — Other Ambulatory Visit (HOSPITAL_BASED_OUTPATIENT_CLINIC_OR_DEPARTMENT_OTHER): Payer: Self-pay | Admitting: Family Medicine

## 2022-01-21 NOTE — Telephone Encounter (Signed)
Pt requesting from pharmacy a 90 day prescription for Losartan potassium 50 mg tab.

## 2022-01-22 ENCOUNTER — Other Ambulatory Visit (HOSPITAL_BASED_OUTPATIENT_CLINIC_OR_DEPARTMENT_OTHER): Payer: Self-pay | Admitting: Family Medicine

## 2022-01-22 DIAGNOSIS — I10 Essential (primary) hypertension: Secondary | ICD-10-CM

## 2022-01-22 DIAGNOSIS — N1831 Chronic kidney disease, stage 3a: Secondary | ICD-10-CM

## 2022-01-22 DIAGNOSIS — R7401 Elevation of levels of liver transaminase levels: Secondary | ICD-10-CM

## 2022-01-22 DIAGNOSIS — R809 Proteinuria, unspecified: Secondary | ICD-10-CM

## 2022-01-22 NOTE — Telephone Encounter (Signed)
Pt was called and he is aware that his medication for refilled this time. I stated to the pt that is is overdue for an follow-up appt as well I told him to make one soon as he can. Pt understood.  Thanks

## 2022-01-22 NOTE — Progress Notes (Signed)
Patient overdue for follow-up. Based on prior labs, patient will need to have follow-up on urine MA/Cr as well as serum creatinine. Orders have been placed for these.

## 2022-04-29 ENCOUNTER — Other Ambulatory Visit (HOSPITAL_BASED_OUTPATIENT_CLINIC_OR_DEPARTMENT_OTHER): Payer: Self-pay | Admitting: Family Medicine

## 2022-05-15 ENCOUNTER — Other Ambulatory Visit (HOSPITAL_BASED_OUTPATIENT_CLINIC_OR_DEPARTMENT_OTHER): Payer: Self-pay | Admitting: Family Medicine

## 2022-06-08 ENCOUNTER — Ambulatory Visit (HOSPITAL_BASED_OUTPATIENT_CLINIC_OR_DEPARTMENT_OTHER): Admitting: Family Medicine

## 2022-08-08 ENCOUNTER — Other Ambulatory Visit (HOSPITAL_BASED_OUTPATIENT_CLINIC_OR_DEPARTMENT_OTHER): Payer: Self-pay | Admitting: Family Medicine

## 2022-09-02 ENCOUNTER — Ambulatory Visit (HOSPITAL_BASED_OUTPATIENT_CLINIC_OR_DEPARTMENT_OTHER): Admitting: Family Medicine

## 2022-11-17 ENCOUNTER — Ambulatory Visit
Admission: EM | Admit: 2022-11-17 | Discharge: 2022-11-17 | Disposition: A | Discharge status: Home / Self Care | Attending: Physician Assistant | Admitting: Physician Assistant

## 2022-11-17 DIAGNOSIS — M7662 Achilles tendinitis, left leg: Secondary | ICD-10-CM | POA: Diagnosis not present

## 2022-11-17 DIAGNOSIS — J020 Streptococcal pharyngitis: Secondary | ICD-10-CM

## 2022-11-17 LAB — GROUP A STREP BY PCR: Group A Strep by PCR: DETECTED — AB

## 2022-11-17 MED ORDER — AZITHROMYCIN 250 MG PO TABS: 250.0000 mg | ORAL_TABLET | Freq: Every day | ORAL | 0 refills | Status: DC

## 2022-11-17 MED ORDER — DEXAMETHASONE SODIUM PHOSPHATE 10 MG/ML IJ SOLN
10.0000 mg | Freq: Once | INTRAMUSCULAR | Status: AC
  Administered 2022-11-17: 10 mg via INTRAMUSCULAR

## 2022-11-17 MED ORDER — IBUPROFEN 600 MG PO TABS: 600.0000 mg | ORAL_TABLET | Freq: Four times a day (QID) | ORAL | 0 refills | Status: DC | PRN

## 2022-11-17 NOTE — ED Triage Notes (Signed)
Pt c/o L foot pain x2 days, states he woke up w/pain & couldn't walk. Denies any trauma or injury. Also c/o sore throat x2 days states he has been trying to get rid of a cold for the past 3 wks.

## 2022-11-17 NOTE — Discharge Instructions (Addendum)
Take the Azithromycin daily for 5 days for treatment of your strep throat.  Gargle with warm salt water 2-3 times a day to soothe your throat, aid in pain relief, and aid in healing.  You can also use Chloraseptic or Sucrets lozenges, 1 lozenge every 2 hours as needed for throat pain.  For your Achilles tendinitis take the ibuprofen 6 or milligrams every 6 hours with food.  This will help with inflammation.  You can start this tomorrow morning.  Follow the home physical therapy exercises given your discharge paperwork.  If you develop any new or worsening symptoms return for reevaluation.

## 2022-11-17 NOTE — ED Provider Notes (Signed)
[facility]    CSN: [id number] Arrival date & time: 11/17/22  1903      History   Chief Complaint Chief Complaint  Patient presents with   Foot Pain   Sore Throat    HPI Keith French is a 52 y.o. male.   HPI  52 year old male here for evaluation of sore throat and right heel pain.  Patient has a past medical history significant for hypertension, chronic kidney disease, and chronic pain of his right ankle presenting for evaluation of pain and redness where his Achilles meets his heel for the past 2 days.  This is not in the setting of any known injury.  Additionally, he has been experiencing a scratchy sore throat with runny nose and nasal congestion.  Past Medical History:  Diagnosis Date   Back pain    High cholesterol    Hypertension     Patient Active Problem List   Diagnosis Date Noted   Chronic kidney disease 08/05/2021   Transaminitis 08/05/2021   Inflamed acrochordon 08/05/2021   Physical exam, annual 02/20/2021   Hypertension 01/23/2021   Dyslipidemia 01/23/2021   Obesity (BMI 30.0-34.9) 01/23/2021   Chronic pain of right ankle 01/23/2021    History reviewed. No pertinent surgical history.     Home Medications    Prior to Admission medications   Medication Sig Start Date End Date Taking? Authorizing Provider  amLODipine (NORVASC) 10 MG tablet Take 1 tablet (10 mg total) by mouth daily. 09/02/21  Yes de [place], Raymond J, MD  atorvastatin (LIPITOR) 10 MG tablet Take 1 tablet (10 mg total) by mouth daily. 01/23/21  Yes de [place], Raymond J, MD  azithromycin (ZITHROMAX Z-PAK) 250 MG tablet Take 1 tablet (250 mg total) by mouth daily. Take 2 tablets on the first day and then 1 tablet daily thereafter for a total of 5 days of treatment. 11/17/22  Yes Margarette Canada, NP  fluticasone (FLONASE) 50 MCG/ACT nasal spray Place 2 sprays into both nostrils daily. 05/27/21  Yes Melynda Ripple, MD  losartan (COZAAR) 50 MG tablet TAKE 1 TABLET BY MOUTH EVERY  DAY 08/12/22  Yes de [place], Raymond J, MD  metoprolol succinate (TOPROL-XL) 50 MG 24 hr tablet TAKE 1 TABLET BY MOUTH EVERY DAY 05/18/22  Yes de [place], Raymond J, MD  ibuprofen (ADVIL) 600 MG tablet Take 1 tablet (600 mg total) by mouth every 6 (six) hours as needed. 11/17/22   Margarette Canada, NP    Family History Family History  Problem Relation Age of Onset   Hypertension Mother    Prostate cancer Father 29   Heart disease Maternal Grandmother     Social History Social History   Tobacco Use   Smoking status: Never   Smokeless tobacco: Never  Vaping Use   Vaping Use: Never used  Substance Use Topics   Alcohol use: Yes    Comment: socially   Drug use: No     Allergies   Penicillins   Review of Systems Review of Systems  Constitutional:  Negative for fever.  HENT:  Positive for congestion, rhinorrhea and sore throat.   Respiratory:  Negative for cough.   Musculoskeletal:  Positive for arthralgias. Negative for joint swelling.  Skin:  Positive for color change.  Neurological:  Negative for weakness and numbness.     Physical Exam Triage Vital Signs ED Triage Vitals [11/17/22 1936]  Enc Vitals Group     BP (!) 176/109     Pulse Rate 100  Resp 16     Temp 98.8 F (37.1 C)     Temp Source Oral     SpO2 98 %     Weight 230 lb (104.3 kg)     Height 6' (1.829 m)     Head Circumference      Peak Flow      Pain Score 8     Pain Loc      Pain Edu?      Excl. in Lane?    No data found.  Updated Vital Signs BP (!) 176/109 (BP Location: Left Arm)   Pulse 100   Temp 98.8 F (37.1 C) (Oral)   Resp 16   Ht 6' (1.829 m)   Wt 230 lb (104.3 kg)   SpO2 98%   BMI 31.19 kg/m   Visual Acuity Right Eye Distance:   Left Eye Distance:   Bilateral Distance:    Right Eye Near:   Left Eye Near:    Bilateral Near:     Physical Exam Vitals and nursing note reviewed.  Constitutional:      Appearance: Normal appearance. He is not ill-appearing.  HENT:     Head:  Normocephalic and atraumatic.     Nose: Congestion and rhinorrhea present.     Comments: Nasal mucosa is erythematous and edematous with clear discharge in both nares.    Mouth/Throat:     Mouth: Mucous membranes are moist.     Pharynx: Oropharynx is clear. Posterior oropharyngeal erythema present. No oropharyngeal exudate.     Comments: Tonsillar pillars are mildly erythematous and there is erythema to the soft palate.  Posterior oropharynx demonstrates erythema and injection with clear postnasal drip.  No exudate appreciated. Cardiovascular:     Rate and Rhythm: Normal rate and regular rhythm.     Pulses: Normal pulses.     Heart sounds: Normal heart sounds. No murmur heard.    No friction rub. No gallop.  Pulmonary:     Effort: Pulmonary effort is normal.     Breath sounds: Normal breath sounds. No wheezing, rhonchi or rales.  Musculoskeletal:        General: Tenderness present. No swelling, deformity or signs of injury. Normal range of motion.     Cervical back: Normal range of motion and neck supple.  Lymphadenopathy:     Cervical: No cervical adenopathy.  Skin:    General: Skin is warm and dry.     Findings: Erythema present.  Neurological:     Mental Status: He is alert.      UC Treatments / Results  Labs (all labs ordered are listed, but only abnormal results are displayed) Labs Reviewed  GROUP A STREP BY PCR - Abnormal; Notable for the following components:      Result Value   Group A Strep by PCR DETECTED (*)    All other components within normal limits    EKG   Radiology No results found.  Procedures Procedures (including critical care time)  Medications Ordered in UC Medications  dexamethasone (DECADRON) injection 10 mg (10 mg Intramuscular Given 11/17/22 2008)    Initial Impression / Assessment and Plan / UC Course  I have reviewed the triage vital signs and the nursing notes.  Pertinent labs & imaging results that were available during my care of  the patient were reviewed by me and considered in my medical decision making (see chart for details).   Patient is a pleasant, nontoxic-appearing 52 year old male presenting for evaluation of 2  days worth of pain in his left Achilles and 2 days of a scratchy sore throat with associated runny nose and nasal congestion.  Patient is not in any acute distress.  His upper respiratory exam does reveal inflamed nasal mucosa with clear rhinorrhea and clear postnasal drip.  Tonsillar pillars are erythematous but free of exudate.  I will order a strep PCR to evaluate for the presence of strep but I am suspicious is more related to postnasal drip.  He has some mild erythema at the heel where the Achilles tendon inserts into the calcaneus.  The area is tender to touch but is not warm.  There is no induration or fluctuance.  DP and PT pulses are 2+.  His exam is consistent with Achilles tendinitis.  I will give him a injection of Decadron in clinic and have him use NSAIDs at home along with stretches.  Strep PCR is positive.  I will discharge patient home with a diagnosis of strep pharyngitis and insertional Achilles tendinitis.  I will prescribe azithromycin for treatment of strep throat as patient has allergy to penicillins.  He can use over-the-counter NSAIDs to help with his Achilles pain.  Home PT exercises provided.  Return precautions reviewed.   Final Clinical Impressions(s) / UC Diagnoses   Final diagnoses:  Strep pharyngitis  Achilles tendinitis of left lower extremity     Discharge Instructions      Take the Azithromycin daily for 5 days for treatment of your strep throat.  Gargle with warm salt water 2-3 times a day to soothe your throat, aid in pain relief, and aid in healing.  You can also use Chloraseptic or Sucrets lozenges, 1 lozenge every 2 hours as needed for throat pain.  For your Achilles tendinitis take the ibuprofen 6 or milligrams every 6 hours with food.  This will help with  inflammation.  You can start this tomorrow morning.  Follow the home physical therapy exercises given your discharge paperwork.  If you develop any new or worsening symptoms return for reevaluation.      ED Prescriptions     Medication Sig Dispense Auth. Provider   azithromycin (ZITHROMAX Z-PAK) 250 MG tablet Take 1 tablet (250 mg total) by mouth daily. Take 2 tablets on the first day and then 1 tablet daily thereafter for a total of 5 days of treatment. 6 tablet Margarette Canada, NP   ibuprofen (ADVIL) 600 MG tablet Take 1 tablet (600 mg total) by mouth every 6 (six) hours as needed. 30 tablet Margarette Canada, NP      PDMP not reviewed this encounter.   Margarette Canada, NP 11/17/22 2026

## 2023-01-24 ENCOUNTER — Other Ambulatory Visit (HOSPITAL_BASED_OUTPATIENT_CLINIC_OR_DEPARTMENT_OTHER): Payer: Self-pay | Admitting: Family Medicine

## 2023-01-25 ENCOUNTER — Other Ambulatory Visit (HOSPITAL_BASED_OUTPATIENT_CLINIC_OR_DEPARTMENT_OTHER): Payer: Self-pay | Admitting: Family Medicine

## 2023-02-10 ENCOUNTER — Encounter (HOSPITAL_BASED_OUTPATIENT_CLINIC_OR_DEPARTMENT_OTHER): Payer: Self-pay | Admitting: Family Medicine

## 2023-02-10 ENCOUNTER — Ambulatory Visit (INDEPENDENT_AMBULATORY_CARE_PROVIDER_SITE_OTHER): Admitting: Family Medicine

## 2023-02-10 VITALS — BP 184/98 | HR 70 | Temp 97.7°F | Ht 72.0 in | Wt 236.0 lb

## 2023-02-10 DIAGNOSIS — Z125 Encounter for screening for malignant neoplasm of prostate: Secondary | ICD-10-CM

## 2023-02-10 DIAGNOSIS — Z0000 Encounter for general adult medical examination without abnormal findings: Secondary | ICD-10-CM

## 2023-02-10 MED ORDER — METOPROLOL SUCCINATE ER 50 MG PO TB24: 50.0000 mg | ORAL_TABLET | Freq: Every day | ORAL | 1 refills | Status: DC

## 2023-02-10 MED ORDER — AMLODIPINE BESYLATE 5 MG PO TABS: 5.0000 mg | ORAL_TABLET | Freq: Every day | ORAL | 1 refills | Status: DC

## 2023-02-10 MED ORDER — LOSARTAN POTASSIUM 50 MG PO TABS: 50.0000 mg | ORAL_TABLET | Freq: Every day | ORAL | 0 refills | Status: DC

## 2023-02-10 NOTE — Progress Notes (Signed)
Subjective:    CC: Annual Physical Exam  HPI:  Keith French is a 52 y.o. presenting for annual physical Unfortunately, patient is very overdue for follow-up regarding chronic medical conditions  I reviewed the past medical history, family history, social history, surgical history, and allergies today and no changes were needed.  Please see the problem list section below in epic for further details.  Past Medical History: Past Medical History:  Diagnosis Date   Back pain    High cholesterol    Hypertension    Past Surgical History: History reviewed. No pertinent surgical history. Social History: Social History   Socioeconomic History   Marital status: Married    Spouse name: Not on file   Number of children: Not on file   Years of education: Not on file   Highest education level: Not on file  Occupational History   Not on file  Tobacco Use   Smoking status: Never   Smokeless tobacco: Never  Vaping Use   Vaping Use: Never used  Substance and Sexual Activity   Alcohol use: Yes    Comment: socially   Drug use: No   Sexual activity: Not on file  Other Topics Concern   Not on file  Social History Narrative   Not on file   Social Determinants of Health   Financial Resource Strain: Not on file  Food Insecurity: Not on file  Transportation Needs: Not on file  Physical Activity: Not on file  Stress: Not on file  Social Connections: Not on file   Family History: Family History  Problem Relation Age of Onset   Hypertension Mother    Prostate cancer Father 84   Heart disease Maternal Grandmother    Allergies: Allergies  Allergen Reactions   Penicillins Other (See Comments)    unknown   Medications: See med rec.  Review of Systems: No headache, visual changes, nausea, vomiting, diarrhea, constipation, dizziness, abdominal pain, skin rash, fevers, chills, night sweats, swollen lymph nodes, weight loss, chest pain, body aches, joint swelling, muscle aches,  shortness of breath, mood changes, visual or auditory hallucinations.  Objective:    BP (!) 183/107 (BP Location: Left Arm, Patient Position: Sitting, Cuff Size: Large)   Pulse 70   Temp 97.7 F (36.5 C) (Oral)   Ht 6' (1.829 m)   Wt 236 lb (107 kg)   SpO2 100%   BMI 32.01 kg/m   General: Well Developed, well nourished, and in no acute distress. Neuro: Alert and oriented x3, extra-ocular muscles intact, sensation grossly intact. Cranial nerves II through XII are intact, motor, sensory, and coordinative functions are all intact. HEENT: Normocephalic, atraumatic, pupils equal round reactive to light, neck supple, no masses, no lymphadenopathy, thyroid nonpalpable. Oropharynx, nasopharynx, external ear canals are unremarkable. Skin: Warm and dry, no rashes noted. Cardiac: Regular rate and rhythm, no murmurs rubs or gallops. Respiratory: Clear to auscultation bilaterally. Not using accessory muscles, speaking in full sentences. Abdominal: Soft, nontender, nondistended, positive bowel sounds, no masses, no organomegaly. Musculoskeletal: Shoulder, elbow, wrist, hip, knee, ankle stable, and with full range of motion.  Impression and Recommendations:    Wellness examination Assessment & Plan: Discussed health maintenance issues and anticipatory guidance Discussed immunization recommendations Check labs as below Recommend regular dental screening visits, patient understands, plans to schedule Referral placed for colonoscopy for colon cancer screening The natural history of prostate cancer and ongoing controversy regarding screening and potential treatment outcomes of prostate cancer has been discussed with the patient. The  meaning of a false positive PSA and a false negative PSA has been discussed. He indicates understanding of the limitations of this screening test and wishes to proceed with screening PSA testing  Orders: -     CBC with Differential/Platelet; Future -     Comprehensive  metabolic panel; Future -     Hemoglobin A1c; Future -     Lipid panel; Future -     TSH Rfx on Abnormal to Free T4; Future -     PSA Total (Reflex To Free); Future  Prostate cancer screening -     PSA Total (Reflex To Free); Future  Other orders -     Losartan Potassium; Take 1 tablet (50 mg total) by mouth daily.  Dispense: 90 tablet; Refill: 0 -     Metoprolol Succinate ER; Take 1 tablet (50 mg total) by mouth daily.  Dispense: 90 tablet; Refill: 1 -     amLODIPine Besylate; Take 1 tablet (5 mg total) by mouth daily.  Dispense: 30 tablet; Refill: 1  Blood pressure notably elevated in office today, he has been without most of his medications both pertaining to hyperlipidemia and hypertension.  Refill of some medications completed today, we will start with lower dosages of medication such as amlodipine in order to reduce risk of potential side effects on resuming these.  Return in about 3 weeks (around 03/03/2023) for hypertension.   ___________________________________________ Liel Rudden de [place], MD, ABFM, CAQSM Primary Care and Sports Medicine [facility]

## 2023-02-10 NOTE — Patient Instructions (Signed)
  Medication Instructions:  Your physician recommends that you continue on your current medications as directed. Please refer to the Current Medication list given to you today. --If you need a refill on any your medications before your next appointment, please call your pharmacy first. If no refills are authorized on file call the office.-- Lab Work: Your physician has recommended that you have lab work today: No If you have labs (blood work) drawn today and your tests are completely normal, you will receive your results via MyChart message OR a phone call from our staff.  Please ensure you check your voicemail in the event that you authorized detailed messages to be left on a delegated number. If you have any lab test that is abnormal or we need to change your treatment, we will call you to review the results.  Referrals/Procedures/Imaging: No  Follow-Up: Your next appointment:   Your physician recommends that you schedule a follow-up appointment in: 3-4 weeks  with Dr. de Cuba.  You will receive a text message or e-mail with a link to a survey about your care and experience with us today! We would greatly appreciate your feedback!   Thanks for letting us be apart of your health journey!!  Primary Care and Sports Medicine   Dr. Raymond de Cuba   We encourage you to activate your patient portal called "MyChart".  Sign up information is provided on this After Visit Summary.  MyChart is used to connect with patients for Virtual Visits (Telemedicine).  Patients are able to view lab/test results, encounter notes, upcoming appointments, etc.  Non-urgent messages can be sent to your provider as well. To learn more about what you can do with MyChart, please visit --  [email or web address].    

## 2023-02-10 NOTE — Assessment & Plan Note (Addendum)
Discussed health maintenance issues and anticipatory guidance Discussed immunization recommendations Check labs as below Recommend regular dental screening visits, patient understands, plans to schedule Referral placed for colonoscopy for colon cancer screening The natural history of prostate cancer and ongoing controversy regarding screening and potential treatment outcomes of prostate cancer has been discussed with the patient. The meaning of a false positive PSA and a false negative PSA has been discussed. He indicates understanding of the limitations of this screening test and wishes to proceed with screening PSA testing

## 2023-02-14 ENCOUNTER — Emergency Department
Admission: EM | Admit: 2023-02-14 | Discharge: 2023-02-14 | Disposition: A | Discharge status: Home / Self Care | Attending: Emergency Medicine | Admitting: Emergency Medicine

## 2023-02-14 ENCOUNTER — Emergency Department

## 2023-02-14 ENCOUNTER — Encounter: Payer: Self-pay | Admitting: Emergency Medicine

## 2023-02-14 ENCOUNTER — Other Ambulatory Visit: Payer: Self-pay

## 2023-02-14 DIAGNOSIS — T7840XA Allergy, unspecified, initial encounter: Secondary | ICD-10-CM | POA: Insufficient documentation

## 2023-02-14 DIAGNOSIS — R220 Localized swelling, mass and lump, head: Secondary | ICD-10-CM | POA: Diagnosis present

## 2023-02-14 DIAGNOSIS — L03213 Periorbital cellulitis: Secondary | ICD-10-CM

## 2023-02-14 LAB — CBC WITH DIFFERENTIAL/PLATELET
Abs Immature Granulocytes: 0.01 10*3/uL (ref 0.00–0.07)
Basophils Absolute: 0 10*3/uL (ref 0.0–0.1)
Basophils Relative: 0 %
Eosinophils Absolute: 0.3 10*3/uL (ref 0.0–0.5)
Eosinophils Relative: 4 %
HCT: 43.1 % (ref 39.0–52.0)
Hemoglobin: 14.1 g/dL (ref 13.0–17.0)
Immature Granulocytes: 0 %
Lymphocytes Relative: 26 %
Lymphs Abs: 1.9 10*3/uL (ref 0.7–4.0)
MCH: 27.7 pg (ref 26.0–34.0)
MCHC: 32.7 g/dL (ref 30.0–36.0)
MCV: 84.7 fL (ref 80.0–100.0)
Monocytes Absolute: 0.7 10*3/uL (ref 0.1–1.0)
Monocytes Relative: 9 %
Neutro Abs: 4.5 10*3/uL (ref 1.7–7.7)
Neutrophils Relative %: 61 %
Platelets: 248 10*3/uL (ref 150–400)
RBC: 5.09 MIL/uL (ref 4.22–5.81)
RDW: 14.5 % (ref 11.5–15.5)
WBC: 7.3 10*3/uL (ref 4.0–10.5)
nRBC: 0 % (ref 0.0–0.2)

## 2023-02-14 LAB — COMPREHENSIVE METABOLIC PANEL
ALT: 24 U/L (ref 0–44)
AST: 20 U/L (ref 15–41)
Albumin: 4 g/dL (ref 3.5–5.0)
Alkaline Phosphatase: 64 U/L (ref 38–126)
Anion gap: 8 (ref 5–15)
BUN: 22 mg/dL — ABNORMAL HIGH (ref 6–20)
CO2: 25 mmol/L (ref 22–32)
Calcium: 9.3 mg/dL (ref 8.9–10.3)
Chloride: 104 mmol/L (ref 98–111)
Creatinine, Ser: 1.75 mg/dL — ABNORMAL HIGH (ref 0.61–1.24)
GFR, Estimated: 46 mL/min — ABNORMAL LOW (ref >60)
Glucose, Bld: 105 mg/dL — ABNORMAL HIGH (ref 70–99)
Potassium: 4.7 mmol/L (ref 3.5–5.1)
Sodium: 137 mmol/L (ref 135–145)
Total Bilirubin: 1 mg/dL (ref 0.3–1.2)
Total Protein: 7.8 g/dL (ref 6.5–8.1)

## 2023-02-14 MED ORDER — CLINDAMYCIN HCL 300 MG PO CAPS: 300.0000 mg | ORAL_CAPSULE | Freq: Three times a day (TID) | ORAL | 0 refills | Status: AC

## 2023-02-14 MED ORDER — METHYLPREDNISOLONE 4 MG PO TBPK: ORAL_TABLET | ORAL | 0 refills | Status: DC

## 2023-02-14 MED ORDER — CLINDAMYCIN PHOSPHATE 900 MG/50ML IV SOLN
900.0000 mg | Freq: Once | INTRAVENOUS | Status: AC
  Administered 2023-02-14: 900 mg via INTRAVENOUS
  Filled 2023-02-14: qty 50

## 2023-02-14 MED ORDER — IOHEXOL 300 MG/ML  SOLN
75.0000 mL | Freq: Once | INTRAMUSCULAR | Status: AC | PRN
Start: 2023-02-14 — End: 2023-02-14
  Administered 2023-02-14: 75 mL via INTRAVENOUS

## 2023-02-14 MED ORDER — FAMOTIDINE IN NACL 20-0.9 MG/50ML-% IV SOLN
20.0000 mg | Freq: Once | INTRAVENOUS | Status: AC
  Administered 2023-02-14: 20 mg via INTRAVENOUS
  Filled 2023-02-14: qty 50

## 2023-02-14 MED ORDER — DIPHENHYDRAMINE HCL 50 MG/ML IJ SOLN
25.0000 mg | Freq: Once | INTRAMUSCULAR | Status: AC
  Administered 2023-02-14: 25 mg via INTRAVENOUS
  Filled 2023-02-14: qty 1

## 2023-02-14 MED ORDER — METHYLPREDNISOLONE SODIUM SUCC 125 MG IJ SOLR
125.0000 mg | Freq: Once | INTRAMUSCULAR | Status: AC
  Administered 2023-02-14: 125 mg via INTRAVENOUS
  Filled 2023-02-14: qty 2

## 2023-02-14 NOTE — ED Notes (Signed)
See triage note  Presents with facial swelling to left eye and side of face  States he used some hair dye 2 days ago  Has used benadryl and hydrocortisone cream

## 2023-02-14 NOTE — ED Triage Notes (Signed)
Patient to ED via POV for allergic reaction. Patient states he is allergic to nova hair dye- left eye swollen shut x2 days. States this has occurred before.

## 2023-02-14 NOTE — Discharge Instructions (Signed)
You were seen in the emergency department today for your facial swelling.  I suspect that this is likely related to an allergic reaction, but I am concerned that you have developed an infection in this area as well.  I have sent a prescription for a steroid course and antibiotics to your pharmacy.  Please take these as directed.  If you are not having improvement in 48 hours or if you are having significant worsening, please return to the ER immediately for further evaluation.  Otherwise, please follow with your primary care doctor within a few days for reevaluation.

## 2023-02-14 NOTE — ED Provider Notes (Signed)
[facility] Provider Note    Event Date/Time   First MD Initiated Contact with Patient 02/14/23 1058     (approximate)   History   Allergic Reaction   HPI  Keith French is a 52 y.o. male presenting to the emergency department for evaluation of left facial swelling.  Patient has a history of an allergic reaction to hair dye, but tried a new natural hair dye on Friday.  He then began to develop swelling and a rash over the left side of head that is now tracked over the left side of his face with significant periorbital swelling.  His eye is swollen shut at rest, but he is able to open it and denies any visual changes.  No pain with eye movements.  No history of trauma.  No fevers.  Denies rash, nausea, vomiting.  Has been taking Benadryl at home and using topical hydrocortisone cream with limited benefit.     Physical Exam   Triage Vital Signs: ED Triage Vitals  Enc Vitals Group     BP 02/14/23 1039 (!) 154/96     Pulse Rate 02/14/23 1039 77     Resp 02/14/23 1039 18     Temp 02/14/23 1039 98.6 F (37 C)     Temp Source 02/14/23 1039 Oral     SpO2 02/14/23 1039 97 %     Weight 02/14/23 1045 235 lb 14.3 oz (107 kg)     Height 02/14/23 1045 6' (1.829 m)     Head Circumference --      Peak Flow --      Pain Score 02/14/23 1040 0     Pain Loc --      Pain Edu? --      Excl. in GC? --     Most recent vital signs: Vitals:   02/14/23 1405 02/14/23 1431  BP: (!) 170/90 (!) 168/88  Pulse: 60 68  Resp: 16 16  Temp:    SpO2: 99% 99%     General: Awake, interactive  HEENT: There is extensive periorbital swelling noted.  The swelling extends into the left scalp where there is overlying erythema, small pustular skin changes somewhat follicular in appearance.  There is mild symmetric bilateral conjunctival injection.  Extraocular movements are intact without pain.  Pupils are equal and reactive bilaterally.  Patient with 20/40 vision in left eye,  20/20 in right eye, but is not wearing his corrective lenses. CV:  Regular rate, good peripheral perfusion.  Resp:  Lungs clear, unlabored respirations.  Abd:  Soft, nondistended.  Neuro:  Symmetric facial movement, fluid speech   ED Results / Procedures / Treatments   Labs (all labs ordered are listed, but only abnormal results are displayed) Labs Reviewed  COMPREHENSIVE METABOLIC PANEL - Abnormal; Notable for the following components:      Result Value   Glucose, Bld 105 (*)    BUN 22 (*)    Creatinine, Ser 1.75 (*)    GFR, Estimated 46 (*)    All other components within normal limits  CBC WITH DIFFERENTIAL/PLATELET     EKG EKG independently reviewed interpreted by myself (ER attending) demonstrates:    RADIOLOGY Imaging independently reviewed and interpreted by myself demonstrates:  CT demonstrates extensive periorbital edema without abscess or postseptal component.  PROCEDURES:  Critical Care performed: No  Procedures   MEDICATIONS ORDERED IN ED: Medications  diphenhydrAMINE (BENADRYL) injection 25 mg (25 mg Intravenous Given 02/14/23 1153)  methylPREDNISolone sodium succinate (  SOLU-MEDROL) 125 mg/2 mL injection 125 mg (125 mg Intravenous Given 02/14/23 1153)  famotidine (PEPCID) IVPB 20 mg premix (0 mg Intravenous Stopped 02/14/23 1324)  iohexol (OMNIPAQUE) 300 MG/ML solution 75 mL (75 mLs Intravenous Contrast Given 02/14/23 1235)  clindamycin (CLEOCIN) IVPB 900 mg (0 mg Intravenous Stopped 02/14/23 1405)     IMPRESSION / MDM / ASSESSMENT AND PLAN / ED COURSE  I reviewed the triage vital signs and the nursing notes.  Differential diagnosis includes, but is not limited to, periorbital edema secondary to allergic reaction, angioedema, preseptal cellulitis, orbital cellulitis, orbital abscess,, clinical presentation not consistent with anaphylaxis  Patient's presentation is most consistent with acute presentation with potential threat to life or bodily  function.  35 male presenting with extensive periorbital swelling after exposure to hair dye with history of reaction to the same.  Clinical history seems most consistent with an allergic reaction.  However given degree of swelling, consideration for acute infectious process.  Will treat with Solu-Medrol, Benadryl, famotidine.  Do not think there is an indication for epinephrine in the absence of throat swelling, hypertension, systemic symptoms.  CT did result with extensive periorbital edema, per radiology concerning for cellulitis without evidence of abscess or postseptal involvement.  Based on clinical history, suspect large allergic component, but does have some lymphadenopathy and cannot rule out infectious process.  On reevaluation patient actually has significant improvement in his periorbital swelling. Will give a dose of IV clindamycin here in the setting of penicillin allergy and plan to discharge with oral antibiotics and steroid course.      FINAL CLINICAL IMPRESSION(S) / ED DIAGNOSES   Final diagnoses:  Periorbital cellulitis of left eye  Facial swelling     Rx / DC Orders   ED Discharge Orders          Ordered    clindamycin (CLEOCIN) 300 MG capsule  3 times daily        02/14/23 1351    methylPREDNISolone (MEDROL DOSEPAK) 4 MG TBPK tablet        02/14/23 1351             Note:  This document was prepared using Dragon voice recognition software and may include unintentional dictation errors.   Trinna Post, MD 02/14/23 [phone]

## 2023-02-17 ENCOUNTER — Other Ambulatory Visit (HOSPITAL_BASED_OUTPATIENT_CLINIC_OR_DEPARTMENT_OTHER): Payer: Self-pay

## 2023-02-17 DIAGNOSIS — Z125 Encounter for screening for malignant neoplasm of prostate: Secondary | ICD-10-CM

## 2023-02-17 DIAGNOSIS — Z0000 Encounter for general adult medical examination without abnormal findings: Secondary | ICD-10-CM

## 2023-02-18 LAB — CBC WITH DIFFERENTIAL/PLATELET
Basophils Absolute: 0.1 10*3/uL (ref 0.0–0.2)
Basos: 1 %
EOS (ABSOLUTE): 0.1 10*3/uL (ref 0.0–0.4)
Eos: 1 %
Hematocrit: 42.1 % (ref 37.5–51.0)
Hemoglobin: 13.3 g/dL (ref 13.0–17.7)
Immature Grans (Abs): 0 10*3/uL (ref 0.0–0.1)
Immature Granulocytes: 0 %
Lymphocytes Absolute: 2.5 10*3/uL (ref 0.7–3.1)
Lymphs: 23 %
MCH: 27.1 pg (ref 26.6–33.0)
MCHC: 31.6 g/dL (ref 31.5–35.7)
MCV: 86 fL (ref 79–97)
Monocytes Absolute: 0.7 10*3/uL (ref 0.1–0.9)
Monocytes: 6 %
Neutrophils Absolute: 7.7 10*3/uL — ABNORMAL HIGH (ref 1.4–7.0)
Neutrophils: 69 %
Platelets: 259 10*3/uL (ref 150–450)
RBC: 4.91 x10E6/uL (ref 4.14–5.80)
RDW: 14.5 % (ref 11.6–15.4)
WBC: 11 10*3/uL — ABNORMAL HIGH (ref 3.4–10.8)

## 2023-02-18 LAB — COMPREHENSIVE METABOLIC PANEL
ALT: 31 IU/L (ref 0–44)
AST: 20 IU/L (ref 0–40)
Albumin: 3.9 g/dL (ref 3.8–4.9)
Alkaline Phosphatase: 67 IU/L (ref 44–121)
BUN/Creatinine Ratio: 16 (ref 9–20)
BUN: 29 mg/dL — ABNORMAL HIGH (ref 6–24)
Bilirubin Total: 0.7 mg/dL (ref 0.0–1.2)
CO2: 22 mmol/L (ref 20–29)
Calcium: 9 mg/dL (ref 8.7–10.2)
Chloride: 102 mmol/L (ref 96–106)
Creatinine, Ser: 1.83 mg/dL — ABNORMAL HIGH (ref 0.76–1.27)
Globulin, Total: 3.1 g/dL (ref 1.5–4.5)
Glucose: 99 mg/dL (ref 70–99)
Potassium: 4.4 mmol/L (ref 3.5–5.2)
Sodium: 138 mmol/L (ref 134–144)
Total Protein: 7 g/dL (ref 6.0–8.5)
eGFR: 44 mL/min/{1.73_m2} — ABNORMAL LOW (ref >59)

## 2023-02-18 LAB — LIPID PANEL
Chol/HDL Ratio: 4.4 ratio (ref 0.0–5.0)
Cholesterol, Total: 219 mg/dL — ABNORMAL HIGH (ref 100–199)
HDL: 50 mg/dL (ref >39)
LDL Chol Calc (NIH): 151 mg/dL — ABNORMAL HIGH (ref 0–99)
Triglycerides: 98 mg/dL (ref 0–149)
VLDL Cholesterol Cal: 18 mg/dL (ref 5–40)

## 2023-02-18 LAB — HEMOGLOBIN A1C
Est. average glucose Bld gHb Est-mCnc: 111 mg/dL
Hgb A1c MFr Bld: 5.5 % (ref 4.8–5.6)

## 2023-02-18 LAB — PSA TOTAL (REFLEX TO FREE): Prostate Specific Ag, Serum: 1.1 ng/mL (ref 0.0–4.0)

## 2023-02-18 LAB — TSH RFX ON ABNORMAL TO FREE T4: TSH: 1.26 u[IU]/mL (ref 0.450–4.500)

## 2023-03-02 ENCOUNTER — Other Ambulatory Visit (HOSPITAL_BASED_OUTPATIENT_CLINIC_OR_DEPARTMENT_OTHER): Payer: Self-pay | Admitting: Family Medicine

## 2023-03-02 ENCOUNTER — Ambulatory Visit (HOSPITAL_BASED_OUTPATIENT_CLINIC_OR_DEPARTMENT_OTHER): Admitting: Family Medicine

## 2023-03-02 DIAGNOSIS — R809 Proteinuria, unspecified: Secondary | ICD-10-CM

## 2023-03-02 DIAGNOSIS — R7989 Other specified abnormal findings of blood chemistry: Secondary | ICD-10-CM

## 2023-03-04 ENCOUNTER — Other Ambulatory Visit (HOSPITAL_BASED_OUTPATIENT_CLINIC_OR_DEPARTMENT_OTHER): Payer: Self-pay | Admitting: Family Medicine

## 2023-03-18 ENCOUNTER — Other Ambulatory Visit (HOSPITAL_BASED_OUTPATIENT_CLINIC_OR_DEPARTMENT_OTHER): Payer: Self-pay | Admitting: Family Medicine

## 2023-05-26 ENCOUNTER — Encounter (HOSPITAL_BASED_OUTPATIENT_CLINIC_OR_DEPARTMENT_OTHER): Payer: Self-pay | Admitting: Family Medicine

## 2023-05-26 ENCOUNTER — Ambulatory Visit (HOSPITAL_BASED_OUTPATIENT_CLINIC_OR_DEPARTMENT_OTHER): Admitting: Family Medicine

## 2023-05-26 ENCOUNTER — Other Ambulatory Visit (HOSPITAL_BASED_OUTPATIENT_CLINIC_OR_DEPARTMENT_OTHER): Payer: Self-pay | Admitting: Family Medicine

## 2023-05-26 ENCOUNTER — Encounter (HOSPITAL_BASED_OUTPATIENT_CLINIC_OR_DEPARTMENT_OTHER): Payer: Self-pay | Admitting: *Deleted

## 2023-05-26 VITALS — BP 148/96 | HR 73 | Ht 72.0 in | Wt 241.0 lb

## 2023-05-26 DIAGNOSIS — M109 Gout, unspecified: Secondary | ICD-10-CM | POA: Insufficient documentation

## 2023-05-26 DIAGNOSIS — K625 Hemorrhage of anus and rectum: Secondary | ICD-10-CM | POA: Insufficient documentation

## 2023-05-26 DIAGNOSIS — I10 Essential (primary) hypertension: Secondary | ICD-10-CM | POA: Diagnosis not present

## 2023-05-26 DIAGNOSIS — M1A0710 Idiopathic chronic gout, right ankle and foot, without tophus (tophi): Secondary | ICD-10-CM

## 2023-05-26 MED ORDER — ALLOPURINOL 100 MG PO TABS: 50.0000 mg | ORAL_TABLET | Freq: Every day | ORAL | 1 refills | Status: DC

## 2023-05-26 MED ORDER — PREDNISONE 10 MG PO TABS: ORAL_TABLET | ORAL | 0 refills | Status: AC

## 2023-05-26 MED ORDER — ALLOPURINOL 100 MG PO TABS: 100.0000 mg | ORAL_TABLET | Freq: Every day | ORAL | 1 refills | Status: DC

## 2023-05-26 NOTE — Progress Notes (Signed)
Procedures performed today:    None.  Independent interpretation of notes and tests performed by another provider:   None.  Brief History, Exam, Impression, and Recommendations:    BP (!) 148/90 (BP Location: Right Arm, Patient Position: Sitting, Cuff Size: Normal)   Pulse 73   Ht 6' (1.829 m)   Wt 241 lb (109.3 kg)   SpO2 99%   BMI 32.69 kg/m   Chronic gout of right ankle, unspecified cause Assessment & Plan: Patient had recent visit to urgent care about 3 weeks ago due to right ankle pain and swelling.  There was suspicion for underlying gout given her current issues patient has had with ankle pain and swelling.  They did complete x-ray as well as labs.  Labs indicated elevated uric acid level.  He was started on short steroid taper.  He reports his symptoms did improve some with use of this, however did return after completion of steroids.  He has had intermittent flareups of right ankle pain.  He does have notable history of trauma to right ankle.  He denies any of her having other joints involved such as toes and regards to any pain or swelling of other joints. On exam, patient is in no acute distress, vital signs stable.  Right ankle with swelling present, warmth, notable tenderness to palpation. We discussed prior evaluation at urgent care and that clinically, patient does have high suspicion for underlying gout.  It is notable that patient does have uric acid level which is elevated even during flare which can sometimes be normal during acute episode.  We did discuss treatment options.  Given recurrent notable pain that patient has been experiencing with this, we will look to initiate urate lowering therapy with allopurinol.  However, we will start with steroid taper and patient will begin allopurinol 1 week after starting steroid taper.  We will start at low-dose of allopurinol of 50 mg given underlying CKD.  We will plan for follow-up in about 5 weeks with patient to have labs  completed in 4 weeks to recheck uric acid level.  We will subsequently adjust allopurinol accordingly.   If patient does note any recurrence of flare while tapering steroid and continue with allopurinol, advised on contact the office and we may look to lengthen steroid taper to prevent worsening while adjusting allopurinol  Orders: -     Uric acid; Future  Primary hypertension Assessment & Plan: Blood pressure elevated in office today, patient reports that he has been checking at home occasionally and gets readings similar to that in office today Discussed options with patient, will continue with amlodipine and losartan Plan to gradually decrease and discontinue metoprolol as he indicates this was initially started for blood pressure, denies any history of palpitations or heart racing.  Given that efficacy of his medication however blood pressure is low, will discontinue over time Has associated elevated creatinine, suspect underlying CKD   Other orders -     predniSONE; Take 4 tablets (40 mg total) by mouth daily with breakfast for 5 days, THEN 3 tablets (30 mg total) daily with breakfast for 5 days, THEN 2 tablets (20 mg total) daily with breakfast for 5 days, THEN 1 tablet (10 mg total) daily with breakfast for 5 days.  Dispense: 50 tablet; Refill: 0 -     Allopurinol; Take 0.5 tablets (50 mg total) by mouth daily.  Dispense: 30 tablet; Refill: 1  Return in about 5 weeks (around 06/30/2023).   ___________________________________________ Marcy Salvo  de [place], MD, ABFM, [facility] Primary Care and Sports Medicine [facility]

## 2023-05-26 NOTE — Assessment & Plan Note (Addendum)
Blood pressure elevated in office today, patient reports that he has been checking at home occasionally and gets readings similar to that in office today Discussed options with patient, will continue with amlodipine and losartan Plan to gradually decrease and discontinue metoprolol as he indicates this was initially started for blood pressure, denies any history of palpitations or heart racing.  Given that efficacy of his medication however blood pressure is low, will discontinue over time Has associated elevated creatinine, suspect underlying CKD

## 2023-05-26 NOTE — Assessment & Plan Note (Signed)
Patient had recent visit to urgent care about 3 weeks ago due to right ankle pain and swelling.  There was suspicion for underlying gout given her current issues patient has had with ankle pain and swelling.  They did complete x-ray as well as labs.  Labs indicated elevated uric acid level.  He was started on short steroid taper.  He reports his symptoms did improve some with use of this, however did return after completion of steroids.  He has had intermittent flareups of right ankle pain.  He does have notable history of trauma to right ankle.  He denies any of her having other joints involved such as toes and regards to any pain or swelling of other joints. On exam, patient is in no acute distress, vital signs stable.  Right ankle with swelling present, warmth, notable tenderness to palpation. We discussed prior evaluation at urgent care and that clinically, patient does have high suspicion for underlying gout.  It is notable that patient does have uric acid level which is elevated even during flare which can sometimes be normal during acute episode.  We did discuss treatment options.  Given recurrent notable pain that patient has been experiencing with this, we will look to initiate urate lowering therapy with allopurinol.  However, we will start with steroid taper and patient will begin allopurinol 1 week after starting steroid taper.  We will start at low-dose of allopurinol of 50 mg given underlying CKD.  We will plan for follow-up in about 5 weeks with patient to have labs completed in 4 weeks to recheck uric acid level.  We will subsequently adjust allopurinol accordingly.   If patient does note any recurrence of flare while tapering steroid and continue with allopurinol, advised on contact the office and we may look to lengthen steroid taper to prevent worsening while adjusting allopurinol

## 2023-06-02 ENCOUNTER — Ambulatory Visit (HOSPITAL_BASED_OUTPATIENT_CLINIC_OR_DEPARTMENT_OTHER)

## 2023-06-17 ENCOUNTER — Other Ambulatory Visit (HOSPITAL_BASED_OUTPATIENT_CLINIC_OR_DEPARTMENT_OTHER): Payer: Self-pay | Admitting: Family Medicine

## 2023-06-23 ENCOUNTER — Ambulatory Visit (HOSPITAL_BASED_OUTPATIENT_CLINIC_OR_DEPARTMENT_OTHER): Admitting: Family Medicine

## 2023-06-23 ENCOUNTER — Other Ambulatory Visit (HOSPITAL_BASED_OUTPATIENT_CLINIC_OR_DEPARTMENT_OTHER)

## 2023-06-23 DIAGNOSIS — R7989 Other specified abnormal findings of blood chemistry: Secondary | ICD-10-CM

## 2023-06-23 DIAGNOSIS — R809 Proteinuria, unspecified: Secondary | ICD-10-CM

## 2023-06-23 DIAGNOSIS — M1A0710 Idiopathic chronic gout, right ankle and foot, without tophus (tophi): Secondary | ICD-10-CM

## 2023-06-24 LAB — MICROALBUMIN / CREATININE URINE RATIO
Creatinine, Urine: 121.7 mg/dL
Microalb/Creat Ratio: 63 mg/g{creat} — ABNORMAL HIGH (ref 0–29)
Microalbumin, Urine: 76.7 ug/mL

## 2023-06-24 LAB — URIC ACID: Uric Acid: 7.6 mg/dL (ref 3.8–8.4)

## 2023-06-30 ENCOUNTER — Encounter (HOSPITAL_BASED_OUTPATIENT_CLINIC_OR_DEPARTMENT_OTHER): Payer: Self-pay | Admitting: Family Medicine

## 2023-06-30 ENCOUNTER — Ambulatory Visit (HOSPITAL_BASED_OUTPATIENT_CLINIC_OR_DEPARTMENT_OTHER): Admitting: Family Medicine

## 2023-06-30 VITALS — BP 148/90 | HR 62 | Ht 72.0 in | Wt 240.3 lb

## 2023-06-30 DIAGNOSIS — N1831 Chronic kidney disease, stage 3a: Secondary | ICD-10-CM

## 2023-06-30 DIAGNOSIS — I10 Essential (primary) hypertension: Secondary | ICD-10-CM

## 2023-06-30 DIAGNOSIS — M1A0710 Idiopathic chronic gout, right ankle and foot, without tophus (tophi): Secondary | ICD-10-CM | POA: Diagnosis not present

## 2023-06-30 MED ORDER — LOSARTAN POTASSIUM 100 MG PO TABS: 100.0000 mg | ORAL_TABLET | Freq: Every day | ORAL | 1 refills | Status: DC

## 2023-06-30 MED ORDER — PREDNISONE 5 MG PO TABS: 5.0000 mg | ORAL_TABLET | Freq: Every day | ORAL | 1 refills | Status: DC

## 2023-06-30 NOTE — Progress Notes (Signed)
    Procedures performed today:    None.  Independent interpretation of notes and tests performed by another provider:   None.  Brief History, Exam, Impression, and Recommendations:    BP (!) 148/90 (BP Location: Right Arm, Patient Position: Sitting, Cuff Size: Normal)   Pulse 62   Ht 6' (1.829 m)   Wt 240 lb 4.8 oz (109 kg)   SpO2 98%   BMI 32.59 kg/m   Primary hypertension Assessment & Plan: Blood pressure above goal in office today.  Does report that prior readings at home have also been similar to that in office today.  He does continue with medications including amlodipine, losartan, metoprolol.  No current issues with medications reported. Discussed options, today we will increase dose of losartan from 50 mg to 100 mg.  Can continue with amlodipine and metoprolol at current dose.  Recommend intermittent monitoring of blood pressure at home, DASH diet We will check labs in 4 weeks and monitor blood pressure at follow-up visit  Orders: -     Basic metabolic panel; Future  Chronic gout of right ankle, unspecified cause Assessment & Plan: Recent uric acid level at 7.6, discussed that goal is 6.0.  He has been taking allopurinol 50 mg dose without issues.  He did have another gout attack for which he presented to urgent care and was prescribed steroid taper at that time.  Currently doing well. We discussed options, we will proceed with further dose titration of allopurinol, increasing to 100 mg daily.  Given recurrent gout attack, can also proceed with low-dose of prednisone we will have patient take daily 5 mg dose of prednisone.  Cautioned on side effects.  We will continue with prednisone until we achieve target uric acid level with allopurinol treatment. Patient will return for labs in about 4 weeks, we will follow-up 1 week after that  Orders: -     Uric acid; Future  Stage 3a chronic kidney disease (HCC) Assessment & Plan: Noted on prior labs, we will recheck labs in  the future to monitor progress.  We will also be mindful of medications and dosages related to this underlying issue.   Other orders -     predniSONE; Take 1 tablet (5 mg total) by mouth daily with breakfast.  Dispense: 30 tablet; Refill: 1 -     Losartan Potassium; Take 1 tablet (100 mg total) by mouth daily.  Dispense: 90 tablet; Refill: 1  Return in about 5 weeks (around 08/04/2023) for med check, hypertension.   ___________________________________________ Kirah Stice de [place], MD, ABFM, CAQSM Primary Care and Sports Medicine [facility]

## 2023-06-30 NOTE — Assessment & Plan Note (Signed)
Noted on prior labs, we will recheck labs in the future to monitor progress.  We will also be mindful of medications and dosages related to this underlying issue.

## 2023-06-30 NOTE — Assessment & Plan Note (Signed)
Recent uric acid level at 7.6, discussed that goal is 6.0.  He has been taking allopurinol 50 mg dose without issues.  He did have another gout attack for which he presented to urgent care and was prescribed steroid taper at that time.  Currently doing well. We discussed options, we will proceed with further dose titration of allopurinol, increasing to 100 mg daily.  Given recurrent gout attack, can also proceed with low-dose of prednisone we will have patient take daily 5 mg dose of prednisone.  Cautioned on side effects.  We will continue with prednisone until we achieve target uric acid level with allopurinol treatment. Patient will return for labs in about 4 weeks, we will follow-up 1 week after that

## 2023-06-30 NOTE — Assessment & Plan Note (Signed)
Blood pressure above goal in office today.  Does report that prior readings at home have also been similar to that in office today.  He does continue with medications including amlodipine, losartan, metoprolol.  No current issues with medications reported. Discussed options, today we will increase dose of losartan from 50 mg to 100 mg.  Can continue with amlodipine and metoprolol at current dose.  Recommend intermittent monitoring of blood pressure at home, DASH diet We will check labs in 4 weeks and monitor blood pressure at follow-up visit

## 2023-07-22 ENCOUNTER — Other Ambulatory Visit (HOSPITAL_BASED_OUTPATIENT_CLINIC_OR_DEPARTMENT_OTHER): Payer: Self-pay | Admitting: Family Medicine

## 2023-08-04 ENCOUNTER — Other Ambulatory Visit (HOSPITAL_BASED_OUTPATIENT_CLINIC_OR_DEPARTMENT_OTHER): Payer: Self-pay | Admitting: Family Medicine

## 2023-08-04 ENCOUNTER — Ambulatory Visit (HOSPITAL_BASED_OUTPATIENT_CLINIC_OR_DEPARTMENT_OTHER): Admitting: Family Medicine

## 2023-08-06 NOTE — Telephone Encounter (Signed)
Called pt. He is having a flare up right now but he advised he does not need the prednisone. He had one more refill and he has gotten this refilled and is taking this.

## 2023-08-25 ENCOUNTER — Other Ambulatory Visit (HOSPITAL_BASED_OUTPATIENT_CLINIC_OR_DEPARTMENT_OTHER): Payer: Self-pay | Admitting: Family Medicine

## 2023-08-29 ENCOUNTER — Other Ambulatory Visit (HOSPITAL_BASED_OUTPATIENT_CLINIC_OR_DEPARTMENT_OTHER): Payer: Self-pay | Admitting: Family Medicine

## 2023-08-30 ENCOUNTER — Other Ambulatory Visit (HOSPITAL_BASED_OUTPATIENT_CLINIC_OR_DEPARTMENT_OTHER): Payer: Self-pay | Admitting: Family Medicine

## 2023-08-30 NOTE — Telephone Encounter (Signed)
 Copied from CRM [id number]. Topic: Clinical - Medication Refill >> Aug 30, 2023  9:40 AM Carlatta H wrote: Most Recent Primary Care Visit:  Provider: DE CUBA, RAYMOND J  Department: [facility]  Visit Type: FOLLOW UP  Date: 06/30/2023  Medication: predniSONE  (DELTASONE ) 5 MG tablet [[id number]]  Has the patient contacted their pharmacy? Yes (Agent: If no, request that the patient contact the pharmacy for the refill. If patient does not wish to contact the pharmacy document the reason why and proceed with request.) (Agent: If yes, when and what did the pharmacy advise?)  Is this the correct pharmacy for this prescription? Yes If no, delete pharmacy and type the correct one.  This is the patient's preferred pharmacy:    [phone] [place] - [address] [place] [address] Phone: [phone] Fax: [phone]   Has the prescription been filled recently? No  Is the patient out of the medication? Yes  Has the patient been seen for an appointment in the last year OR does the patient have an upcoming appointment? Yes  Can we respond through MyChart? No  Agent: Please be advised that Rx refills may take up to 3 business days. We ask that you follow-up with your pharmacy.

## 2023-09-08 ENCOUNTER — Encounter (HOSPITAL_BASED_OUTPATIENT_CLINIC_OR_DEPARTMENT_OTHER): Payer: Self-pay | Admitting: Family Medicine

## 2023-09-08 ENCOUNTER — Ambulatory Visit (HOSPITAL_BASED_OUTPATIENT_CLINIC_OR_DEPARTMENT_OTHER): Admitting: Family Medicine

## 2023-09-08 VITALS — BP 147/96 | HR 66 | Ht 72.0 in | Wt 245.0 lb

## 2023-09-08 DIAGNOSIS — I10 Essential (primary) hypertension: Secondary | ICD-10-CM | POA: Diagnosis not present

## 2023-09-08 DIAGNOSIS — M1A0710 Idiopathic chronic gout, right ankle and foot, without tophus (tophi): Secondary | ICD-10-CM

## 2023-09-08 MED ORDER — AMLODIPINE BESYLATE 10 MG PO TABS: 10.0000 mg | ORAL_TABLET | Freq: Every day | ORAL | 1 refills | Status: DC

## 2023-09-08 NOTE — Progress Notes (Signed)
    Procedures performed today:    None.  Independent interpretation of notes and tests performed by another provider:   None.  Brief History, Exam, Impression, and Recommendations:    BP (!) 147/96   Pulse 66   Ht 6' (1.829 m)   Wt 245 lb (111.1 kg)   SpO2 100%   BMI 33.23 kg/m   Primary hypertension Assessment & Plan: Blood pressure remains borderline elevated in office today, did improve slightly on recheck.  He continues with medications as prescribed.  Has been checking blood pressure at home, blood pressure generally has been similar at home as our reading in office today.  No issues with chest pain or headaches. Discussed options, we will continue with losartan and metoprolol as prescribed, we will slightly increase dose of amlodipine.  Did discuss potential side effects to be aware of with adjusting amlodipine dose. Will plan for follow-up in about 4 to 6 weeks to assess progress.  Orders: -     Basic metabolic panel  Chronic gout of right ankle, unspecified cause Assessment & Plan: Patient continues with allopurinol, has not had any issues with medication.  Has not had recent check of uric acid level, we will recheck today.  He has not had any further gout flareups since last appointment Discussed that we will adjust allopurinol based on lab results of uric acid.  If less than 6.0, we will continue with same dose of allopurinol, if still above 6.0, we will look to make slight dose adjustment with allopurinol.  He will continue with prednisone for now as well.  Orders: -     Uric acid  Other orders -     amLODIPine Besylate; Take 1 tablet (10 mg total) by mouth daily. Schedule appt for further refills  Dispense: 90 tablet; Refill: 1  Return in about 6 weeks (around 10/20/2023) for hypertension, gout.   ___________________________________________ Kayliana Codd de [place], MD, ABFM, [facility] Primary Care and Sports Medicine [facility]

## 2023-09-08 NOTE — Assessment & Plan Note (Signed)
Blood pressure remains borderline elevated in office today, did improve slightly on recheck.  He continues with medications as prescribed.  Has been checking blood pressure at home, blood pressure generally has been similar at home as our reading in office today.  No issues with chest pain or headaches. Discussed options, we will continue with losartan and metoprolol as prescribed, we will slightly increase dose of amlodipine.  Did discuss potential side effects to be aware of with adjusting amlodipine dose. Will plan for follow-up in about 4 to 6 weeks to assess progress.

## 2023-09-08 NOTE — Assessment & Plan Note (Addendum)
Patient continues with allopurinol, has not had any issues with medication.  Has not had recent check of uric acid level, we will recheck today.  He has not had any further gout flareups since last appointment Discussed that we will adjust allopurinol based on lab results of uric acid.  If less than 6.0, we will continue with same dose of allopurinol, if still above 6.0, we will look to make slight dose adjustment with allopurinol.  He will continue with prednisone for now as well.

## 2023-09-09 ENCOUNTER — Other Ambulatory Visit (HOSPITAL_BASED_OUTPATIENT_CLINIC_OR_DEPARTMENT_OTHER): Payer: Self-pay | Admitting: Family Medicine

## 2023-09-09 DIAGNOSIS — R7989 Other specified abnormal findings of blood chemistry: Secondary | ICD-10-CM

## 2023-09-09 LAB — BASIC METABOLIC PANEL
BUN/Creatinine Ratio: 8 — ABNORMAL LOW (ref 9–20)
BUN: 18 mg/dL (ref 6–24)
CO2: 24 mmol/L (ref 20–29)
Calcium: 9.7 mg/dL (ref 8.7–10.2)
Chloride: 105 mmol/L (ref 96–106)
Creatinine, Ser: 2.22 mg/dL — ABNORMAL HIGH (ref 0.76–1.27)
Glucose: 72 mg/dL (ref 70–99)
Potassium: 5 mmol/L (ref 3.5–5.2)
Sodium: 143 mmol/L (ref 134–144)
eGFR: 35 mL/min/{1.73_m2} — ABNORMAL LOW (ref >59)

## 2023-09-09 LAB — URIC ACID: Uric Acid: 7 mg/dL (ref 3.8–8.4)

## 2023-09-09 MED ORDER — ALLOPURINOL 100 MG PO TABS: 150.0000 mg | ORAL_TABLET | Freq: Every day | ORAL | 1 refills | Status: DC

## 2023-09-13 ENCOUNTER — Telehealth (HOSPITAL_BASED_OUTPATIENT_CLINIC_OR_DEPARTMENT_OTHER): Payer: Self-pay | Admitting: *Deleted

## 2023-09-13 NOTE — Telephone Encounter (Signed)
This should have been placed locally can we resend this to [facility] for patient?

## 2023-09-13 NOTE — Telephone Encounter (Signed)
Copied from CRM [id number]. Topic: Referral - Question >> Sep 13, 2023  2:44 PM Fuller Mandril wrote: Reason for CRM: Patient called stated provider sent referral and they called him to schedule but it was in [place] and patient does not want to have to drive all the way to [facility] for appt. Would rather a location in [place] if possible. Would like to know if referral is able to be sent to [facility] Phone: [phone] or somewhere else the provider prefers in [place] area. Thank You

## 2023-09-19 ENCOUNTER — Other Ambulatory Visit (HOSPITAL_BASED_OUTPATIENT_CLINIC_OR_DEPARTMENT_OTHER): Payer: Self-pay | Admitting: Family Medicine

## 2023-09-22 ENCOUNTER — Other Ambulatory Visit (HOSPITAL_BASED_OUTPATIENT_CLINIC_OR_DEPARTMENT_OTHER): Payer: Self-pay | Admitting: Family Medicine

## 2023-10-20 ENCOUNTER — Ambulatory Visit (HOSPITAL_BASED_OUTPATIENT_CLINIC_OR_DEPARTMENT_OTHER): Admitting: Family Medicine

## 2023-11-07 ENCOUNTER — Other Ambulatory Visit (HOSPITAL_BASED_OUTPATIENT_CLINIC_OR_DEPARTMENT_OTHER): Payer: Self-pay | Admitting: Family Medicine

## 2023-11-08 ENCOUNTER — Other Ambulatory Visit (HOSPITAL_BASED_OUTPATIENT_CLINIC_OR_DEPARTMENT_OTHER): Payer: Self-pay | Admitting: Family Medicine

## 2023-12-06 ENCOUNTER — Ambulatory Visit (HOSPITAL_BASED_OUTPATIENT_CLINIC_OR_DEPARTMENT_OTHER): Admitting: Family Medicine

## 2023-12-27 ENCOUNTER — Other Ambulatory Visit (HOSPITAL_BASED_OUTPATIENT_CLINIC_OR_DEPARTMENT_OTHER): Payer: Self-pay | Admitting: Family Medicine

## 2023-12-28 ENCOUNTER — Other Ambulatory Visit (HOSPITAL_BASED_OUTPATIENT_CLINIC_OR_DEPARTMENT_OTHER): Payer: Self-pay | Admitting: *Deleted

## 2023-12-28 MED ORDER — METOPROLOL SUCCINATE ER 50 MG PO TB24: 50.0000 mg | ORAL_TABLET | Freq: Every day | ORAL | 1 refills | Status: DC

## 2023-12-28 MED ORDER — LOSARTAN POTASSIUM 50 MG PO TABS: 50.0000 mg | ORAL_TABLET | Freq: Every day | ORAL | 0 refills | Status: DC

## 2023-12-28 MED ORDER — ALLOPURINOL 100 MG PO TABS: 100.0000 mg | ORAL_TABLET | Freq: Every day | ORAL | 1 refills | Status: DC

## 2023-12-28 MED ORDER — AMLODIPINE BESYLATE 10 MG PO TABS: 10.0000 mg | ORAL_TABLET | Freq: Every day | ORAL | 1 refills | Status: DC

## 2024-01-07 ENCOUNTER — Other Ambulatory Visit (HOSPITAL_BASED_OUTPATIENT_CLINIC_OR_DEPARTMENT_OTHER): Payer: Self-pay | Admitting: *Deleted

## 2024-01-07 MED ORDER — ALLOPURINOL 100 MG PO TABS: 100.0000 mg | ORAL_TABLET | Freq: Every day | ORAL | 3 refills | Status: DC

## 2024-01-07 MED ORDER — LOSARTAN POTASSIUM 50 MG PO TABS: 50.0000 mg | ORAL_TABLET | Freq: Every day | ORAL | 3 refills | Status: DC

## 2024-01-07 MED ORDER — AMLODIPINE BESYLATE 10 MG PO TABS: 10.0000 mg | ORAL_TABLET | Freq: Every day | ORAL | 3 refills | Status: DC

## 2024-01-07 MED ORDER — METOPROLOL SUCCINATE ER 50 MG PO TB24: 50.0000 mg | ORAL_TABLET | Freq: Every day | ORAL | 3 refills | Status: AC

## 2024-01-25 ENCOUNTER — Other Ambulatory Visit (HOSPITAL_BASED_OUTPATIENT_CLINIC_OR_DEPARTMENT_OTHER): Payer: Self-pay | Admitting: Family Medicine

## 2024-01-28 ENCOUNTER — Ambulatory Visit (HOSPITAL_BASED_OUTPATIENT_CLINIC_OR_DEPARTMENT_OTHER): Admitting: Family Medicine

## 2024-02-09 ENCOUNTER — Ambulatory Visit (HOSPITAL_BASED_OUTPATIENT_CLINIC_OR_DEPARTMENT_OTHER): Admitting: Family Medicine

## 2024-02-10 ENCOUNTER — Other Ambulatory Visit (HOSPITAL_BASED_OUTPATIENT_CLINIC_OR_DEPARTMENT_OTHER): Payer: Self-pay | Admitting: Family Medicine

## 2024-03-27 ENCOUNTER — Encounter (HOSPITAL_BASED_OUTPATIENT_CLINIC_OR_DEPARTMENT_OTHER): Payer: Self-pay | Admitting: Family Medicine

## 2024-03-29 ENCOUNTER — Ambulatory Visit (HOSPITAL_BASED_OUTPATIENT_CLINIC_OR_DEPARTMENT_OTHER): Admitting: Family Medicine

## 2024-03-31 ENCOUNTER — Ambulatory Visit (HOSPITAL_BASED_OUTPATIENT_CLINIC_OR_DEPARTMENT_OTHER): Admitting: Family Medicine

## 2024-03-31 ENCOUNTER — Encounter (HOSPITAL_BASED_OUTPATIENT_CLINIC_OR_DEPARTMENT_OTHER): Payer: Self-pay | Admitting: Family Medicine

## 2024-03-31 VITALS — BP 139/88 | HR 63 | Ht 72.0 in | Wt 246.0 lb

## 2024-03-31 DIAGNOSIS — I10 Essential (primary) hypertension: Secondary | ICD-10-CM | POA: Diagnosis not present

## 2024-03-31 DIAGNOSIS — N1832 Chronic kidney disease, stage 3b: Secondary | ICD-10-CM

## 2024-03-31 DIAGNOSIS — M1A0710 Idiopathic chronic gout, right ankle and foot, without tophus (tophi): Secondary | ICD-10-CM | POA: Diagnosis not present

## 2024-03-31 DIAGNOSIS — Z0000 Encounter for general adult medical examination without abnormal findings: Secondary | ICD-10-CM

## 2024-03-31 DIAGNOSIS — Z125 Encounter for screening for malignant neoplasm of prostate: Secondary | ICD-10-CM

## 2024-03-31 NOTE — Progress Notes (Signed)
    Procedures performed today:    None.  Independent interpretation of notes and tests performed by another provider:   None.  Brief History, Exam, Impression, and Recommendations:    BP 139/88   Pulse 63   Ht 6' (1.829 m)   Wt 246 lb (111.6 kg)   SpO2 98%   BMI 33.36 kg/m   Primary hypertension Assessment & Plan: Blood pressure in office today is borderline.  Reports that home readings have been better controlled compared to elevation in the past.  He continues with medications as prescribed, denies any issues with chest pain or headaches. Can continue with current medication regimen.  Discussed importance of blood pressure control, particular with underlying CKD. Recommend intermittent monitoring of blood pressure at home, DASH diet  Orders: -     Basic metabolic panel with GFR  Stage 3b chronic kidney disease (HCC) Assessment & Plan: Patient has had progressive increase in serum creatinine/decrease in GFR.  We previously had placed referral to nephrology, however he reports that when he received information about appointment, this was scheduled in [place].  As a result, patient did not have evaluation with nephrology Uncertain as to why referral would have been with clinic in [place].  We will proceed with new referral to [facility] for patient to establish, referral placed today.  Advised that if he does not hear from them within 1 to 2 weeks, recommend reaching back out to us  for further assistance We can proceed with labs today for monitoring  Orders: -     Ambulatory referral to Nephrology -     Basic metabolic panel with GFR  Chronic gout of right ankle, unspecified cause Assessment & Plan: Patient continues with allopurinol , indicates that he still is taking the same dose and did not increase dose of medication.  We can proceed with recheck of uric acid today.  Discussed that goal is for uric acid less than 6.  If not at goal, would recommend  increasing to 150 mg daily with plan to recheck uric acid at new dose  Orders: -     Uric acid -     Uric acid; Future  Wellness examination -     CBC with Differential/Platelet; Future -     Comprehensive metabolic panel with GFR; Future -     Hemoglobin A1c; Future -     Lipid panel; Future -     TSH Rfx on Abnormal to Free T4; Future -     Uric acid; Future -     PSA Total (Reflex To Free); Future  Prostate cancer screening -     PSA Total (Reflex To Free); Future  Return in about 3 months (around 07/01/2024) for CPE with fasting labs 1 week prior.   ___________________________________________ Shykeria Sakamoto de [place], MD, ABFM, CAQSM Primary Care and Sports Medicine [facility]

## 2024-03-31 NOTE — Assessment & Plan Note (Signed)
 Blood pressure in office today is borderline.  Reports that home readings have been better controlled compared to elevation in the past.  He continues with medications as prescribed, denies any issues with chest pain or headaches. Can continue with current medication regimen.  Discussed importance of blood pressure control, particular with underlying CKD. Recommend intermittent monitoring of blood pressure at home, DASH diet

## 2024-03-31 NOTE — Assessment & Plan Note (Signed)
 Patient has had progressive increase in serum creatinine/decrease in GFR.  We previously had placed referral to nephrology, however he reports that when he received information about appointment, this was scheduled in [place].  As a result, patient did not have evaluation with nephrology Uncertain as to why referral would have been with clinic in [place].  We will proceed with new referral to [facility] for patient to establish, referral placed today.  Advised that if he does not hear from them within 1 to 2 weeks, recommend reaching back out to us  for further assistance We can proceed with labs today for monitoring

## 2024-03-31 NOTE — Assessment & Plan Note (Signed)
 Patient continues with allopurinol , indicates that he still is taking the same dose and did not increase dose of medication.  We can proceed with recheck of uric acid today.  Discussed that goal is for uric acid less than 6.  If not at goal, would recommend increasing to 150 mg daily with plan to recheck uric acid at new dose

## 2024-04-01 LAB — BASIC METABOLIC PANEL WITH GFR
BUN/Creatinine Ratio: 11 (ref 9–20)
BUN: 20 mg/dL (ref 6–24)
CO2: 20 mmol/L (ref 20–29)
Calcium: 9.8 mg/dL (ref 8.7–10.2)
Chloride: 102 mmol/L (ref 96–106)
Creatinine, Ser: 1.81 mg/dL — ABNORMAL HIGH (ref 0.76–1.27)
Glucose: 95 mg/dL (ref 70–99)
Potassium: 4.6 mmol/L (ref 3.5–5.2)
Sodium: 139 mmol/L (ref 134–144)
eGFR: 44 mL/min/1.73 — ABNORMAL LOW (ref >59)

## 2024-04-01 LAB — URIC ACID: Uric Acid: 9 mg/dL — ABNORMAL HIGH (ref 3.8–8.4)

## 2024-04-03 ENCOUNTER — Ambulatory Visit (HOSPITAL_BASED_OUTPATIENT_CLINIC_OR_DEPARTMENT_OTHER): Payer: Self-pay | Admitting: Family Medicine

## 2024-04-04 ENCOUNTER — Telehealth (HOSPITAL_BASED_OUTPATIENT_CLINIC_OR_DEPARTMENT_OTHER): Payer: Self-pay | Admitting: *Deleted

## 2024-04-04 DIAGNOSIS — M1A0710 Idiopathic chronic gout, right ankle and foot, without tophus (tophi): Secondary | ICD-10-CM

## 2024-04-04 NOTE — Telephone Encounter (Signed)
 Patient advised with verbal understanding. Will come back in 3-4 weeks to have labs rechecked

## 2024-04-04 NOTE — Telephone Encounter (Signed)
**Note De-identified  [place] Obfuscation** Please advise 

## 2024-04-04 NOTE — Telephone Encounter (Signed)
 Copied from CRM [id number]. Topic: Clinical - Medication Question >> Apr 04, 2024  8:32 AM Everette C wrote: Reason for CRM: The patient would like to discuss increasing their allopurinol  (ZYLOPRIM ) 100 MG tablet [[id number]] prescription. The patient is concerned with their recent uric acid lab results and would like to discuss their prescription options further. Please contact the patient further when possible

## 2024-04-04 NOTE — Addendum Note (Signed)
 Addended by: DE PERU, QUINTIN J on: 04/04/2024 01:06 PM   Modules accepted: Orders

## 2024-04-17 ENCOUNTER — Other Ambulatory Visit (HOSPITAL_BASED_OUTPATIENT_CLINIC_OR_DEPARTMENT_OTHER): Payer: Self-pay | Admitting: Family Medicine

## 2024-07-02 ENCOUNTER — Other Ambulatory Visit (HOSPITAL_BASED_OUTPATIENT_CLINIC_OR_DEPARTMENT_OTHER): Payer: Self-pay | Admitting: Family Medicine

## 2024-07-05 ENCOUNTER — Ambulatory Visit: Payer: Self-pay

## 2024-07-05 NOTE — Telephone Encounter (Signed)
 Nurse triage 2nd attempt to contact patient, no answer, unable to leave message, voicemail is full.

## 2024-07-05 NOTE — Telephone Encounter (Signed)
 Message from Tiffini S sent at 07/05/2024  3:06 PM EST  Reason for Triage: Patient is having tingling and swelling in both ankles- his is wearing loose socks and when walking gets swollen  Please call the patient back at [phone]  Appointment was scheduled with pcp for 07/25/24- waitlist

## 2024-07-05 NOTE — Telephone Encounter (Signed)
 Nurse triage 1st attempt to contact patient, no answer, unable to leave message, voicemail is full.

## 2024-07-25 ENCOUNTER — Ambulatory Visit (HOSPITAL_BASED_OUTPATIENT_CLINIC_OR_DEPARTMENT_OTHER): Admitting: Family Medicine

## 2024-07-31 ENCOUNTER — Other Ambulatory Visit (HOSPITAL_BASED_OUTPATIENT_CLINIC_OR_DEPARTMENT_OTHER)

## 2024-07-31 ENCOUNTER — Encounter (HOSPITAL_BASED_OUTPATIENT_CLINIC_OR_DEPARTMENT_OTHER): Payer: Self-pay

## 2024-08-08 ENCOUNTER — Encounter (HOSPITAL_BASED_OUTPATIENT_CLINIC_OR_DEPARTMENT_OTHER): Admitting: Family Medicine

## 2024-08-08 ENCOUNTER — Encounter (HOSPITAL_BASED_OUTPATIENT_CLINIC_OR_DEPARTMENT_OTHER): Payer: Self-pay | Admitting: Family Medicine

## 2024-08-08 VITALS — BP 157/100 | HR 64 | Ht 72.0 in | Wt 257.3 lb

## 2024-08-08 DIAGNOSIS — Z125 Encounter for screening for malignant neoplasm of prostate: Secondary | ICD-10-CM

## 2024-08-08 DIAGNOSIS — Z0000 Encounter for general adult medical examination without abnormal findings: Secondary | ICD-10-CM

## 2024-08-08 DIAGNOSIS — I10 Essential (primary) hypertension: Secondary | ICD-10-CM

## 2024-08-08 DIAGNOSIS — M1A0710 Idiopathic chronic gout, right ankle and foot, without tophus (tophi): Secondary | ICD-10-CM | POA: Diagnosis not present

## 2024-08-08 LAB — COMPREHENSIVE METABOLIC PANEL WITH GFR
ALT: 59 IU/L — ABNORMAL HIGH (ref 0–44)
AST: 41 IU/L — ABNORMAL HIGH (ref 0–40)
Albumin: 4.3 g/dL (ref 3.8–4.9)
Alkaline Phosphatase: 101 IU/L (ref 47–123)
BUN/Creatinine Ratio: 8 — ABNORMAL LOW (ref 9–20)
BUN: 14 mg/dL (ref 6–24)
Bilirubin Total: 0.5 mg/dL (ref 0.0–1.2)
CO2: 25 mmol/L (ref 20–29)
Calcium: 9.8 mg/dL (ref 8.7–10.2)
Chloride: 103 mmol/L (ref 96–106)
Creatinine, Ser: 1.71 mg/dL — ABNORMAL HIGH (ref 0.76–1.27)
Globulin, Total: 3.2 g/dL (ref 1.5–4.5)
Glucose: 98 mg/dL (ref 70–99)
Potassium: 5.9 mmol/L — ABNORMAL HIGH (ref 3.5–5.2)
Sodium: 140 mmol/L (ref 134–144)
Total Protein: 7.5 g/dL (ref 6.0–8.5)
eGFR: 47 mL/min/1.73 — ABNORMAL LOW

## 2024-08-08 LAB — HEMOGLOBIN A1C
Est. average glucose Bld gHb Est-mCnc: 126 mg/dL
Hgb A1c MFr Bld: 6 % — ABNORMAL HIGH (ref 4.8–5.6)

## 2024-08-08 LAB — URIC ACID: Uric Acid: 7.7 mg/dL (ref 3.8–8.4)

## 2024-08-08 LAB — LIPID PANEL
Chol/HDL Ratio: 5 ratio (ref 0.0–5.0)
Cholesterol, Total: 230 mg/dL — ABNORMAL HIGH (ref 100–199)
HDL: 46 mg/dL
LDL Chol Calc (NIH): 165 mg/dL — ABNORMAL HIGH (ref 0–99)
Triglycerides: 105 mg/dL (ref 0–149)
VLDL Cholesterol Cal: 19 mg/dL (ref 5–40)

## 2024-08-08 LAB — CBC WITH DIFFERENTIAL/PLATELET
Basophils Absolute: 0 x10E3/uL (ref 0.0–0.2)
Basos: 1 %
EOS (ABSOLUTE): 0.2 x10E3/uL (ref 0.0–0.4)
Eos: 4 %
Hematocrit: 42.9 % (ref 37.5–51.0)
Hemoglobin: 13.8 g/dL (ref 13.0–17.7)
Immature Grans (Abs): 0 x10E3/uL (ref 0.0–0.1)
Immature Granulocytes: 0 %
Lymphocytes Absolute: 1.9 x10E3/uL (ref 0.7–3.1)
Lymphs: 30 %
MCH: 27.9 pg (ref 26.6–33.0)
MCHC: 32.2 g/dL (ref 31.5–35.7)
MCV: 87 fL (ref 79–97)
Monocytes Absolute: 0.7 x10E3/uL (ref 0.1–0.9)
Monocytes: 11 %
Neutrophils Absolute: 3.5 x10E3/uL (ref 1.4–7.0)
Neutrophils: 54 %
Platelets: 274 x10E3/uL (ref 150–450)
RBC: 4.95 x10E6/uL (ref 4.14–5.80)
RDW: 14.6 % (ref 11.6–15.4)
WBC: 6.3 x10E3/uL (ref 3.4–10.8)

## 2024-08-08 LAB — PSA TOTAL (REFLEX TO FREE): Prostate Specific Ag, Serum: 1.8 ng/mL (ref 0.0–4.0)

## 2024-08-08 LAB — TSH RFX ON ABNORMAL TO FREE T4: TSH: 1.91 u[IU]/mL (ref 0.450–4.500)

## 2024-08-08 MED ORDER — ALLOPURINOL 100 MG PO TABS: 50.0000 mg | ORAL_TABLET | Freq: Every day | ORAL | 3 refills | Status: AC

## 2024-08-08 NOTE — Assessment & Plan Note (Signed)
 Routine HCM labs ordered. HCM reviewed/discussed. Anticipatory guidance regarding healthy weight, lifestyle and choices given. Recommend healthy diet.  Recommend approximately 150 minutes/week of moderate intensity exercise Recommend regular dental and vision exams Always use seatbelt/lap and shoulder restraints Recommend using smoke alarms and checking batteries at least twice a year Recommend using sunscreen when outside Discussed colon cancer screening recommendations, options.  Patient UTD Discussed recommendations for shingles vaccine.  Patient will consider Discussed immunization recommendations The natural history of prostate cancer and ongoing controversy regarding screening and potential treatment outcomes of prostate cancer has been discussed with the patient. The meaning of a false positive PSA and a false negative PSA has been discussed. He indicates understanding of the limitations of this screening test and wishes to proceed with screening PSA testing.

## 2024-08-08 NOTE — Assessment & Plan Note (Signed)
 Blood pressure elevated in office today.  Improved slightly on recheck.  We discussed considerations.  Can continue with current medication regimen.  We will check labs related to physical today. If making medication adjustments, likely would increase dose of losartan  He will return for nurse visit 3 weeks for blood pressure recheck

## 2024-08-08 NOTE — Assessment & Plan Note (Signed)
 Patient reports that he ran out of allopurinol  and has thus not been taking medication for about 3 weeks,.  When he was taking medication, denies any issues with medication.  He does note that he has not had any recent flareups from gout.  Discussed importance of continuing with allopurinol  to maintain lower uric acid level and reduce risk of gout flareups. We will restart allopurinol  at low-dose of 50 mg given underlying CKD. We will recheck uric acid in about 3 weeks to assess progress and adjust dose of allopurinol  accordingly.

## 2024-08-08 NOTE — Patient Instructions (Signed)
" °  Medication Instructions:  Your physician recommends that you continue on your current medications as directed. Please refer to the Current Medication list given to you today. --If you need a refill on any your medications before your next appointment, please call your pharmacy first. If no refills are authorized on file call the office.-- Lab Work: Your physician has recommended that you have lab work today: yes If you have labs (blood work) drawn today and your tests are completely normal, you will receive your results via MyChart message OR a phone call from our staff.  Please ensure you check your voicemail in the event that you authorized detailed messages to be left on a delegated number. If you have any lab test that is abnormal or we need to change your treatment, we will call you to review the results.  Referrals/Procedures/Imaging: no  Follow-Up: Your next appointment:   Your physician recommends that you schedule a follow-up appointment in: 3 week nurse visit for BP recheck and for labs and 3 months follow up with Dr. de Cuba  You will receive a text message or e-mail with a link to a survey about your care and experience with us  today! We would greatly appreciate your feedback!   Thanks for letting us  be apart of your health journey!!  Primary Care and Sports Medicine   Dr. Quintin sheerer Cuba   We encourage you to activate your patient portal called MyChart.  Sign up information is provided on this After Visit Summary.  MyChart is used to connect with patients for Virtual Visits (Telemedicine).  Patients are able to view lab/test results, encounter notes, upcoming appointments, etc.  Non-urgent messages can be sent to your provider as well. To learn more about what you can do with MyChart, please visit --  [email or web address].    "

## 2024-08-08 NOTE — Progress Notes (Signed)
 " Subjective:    CC: Annual Physical Exam  HPI:  Keith French is a 53 y.o. presenting for annual physical  I reviewed the past medical history, family history, social history, surgical history, and allergies today and no changes were needed.  Please see the problem list section below in epic for further details.  Past Medical History: Past Medical History:  Diagnosis Date   Allergy Penicillin   Back pain    High cholesterol    Hypertension    Past Surgical History: History reviewed. No pertinent surgical history. Social History: Social History   Socioeconomic History   Marital status: Married    Spouse name: Not on file   Number of children: Not on file   Years of education: Not on file   Highest education level: Not on file  Occupational History   Not on file  Tobacco Use   Smoking status: Never    Passive exposure: Never   Smokeless tobacco: Never  Vaping Use   Vaping status: Never Used  Substance and Sexual Activity   Alcohol use: Yes    Comment: socially   Drug use: No   Sexual activity: Not on file  Other Topics Concern   Not on file  Social History Narrative   Not on file   Social Drivers of Health   Tobacco Use: Low Risk  (05/01/2024)   Received from [facility]   Patient History    Smoking Tobacco Use: Never    Smokeless Tobacco Use: Never    Passive Exposure: Not on file  Financial Resource Strain: Low Risk (08/08/2024)   Overall Financial Resource Strain (CARDIA)    Difficulty of Paying Living Expenses: Not hard at all  Food Insecurity: No Food Insecurity (08/08/2024)   Epic    Worried About Radiation Protection Practitioner of Food in the Last Year: Never true    Ran Out of Food in the Last Year: Never true  Transportation Needs: No Transportation Needs (08/08/2024)   Epic    Lack of Transportation (Medical): No    Lack of Transportation (Non-Medical): No  Physical Activity: Insufficiently Active (08/08/2024)   Exercise Vital Sign    Days of Exercise per Week:  1 day    Minutes of Exercise per Session: 30 min  Stress: No Stress Concern Present (08/08/2024)   [employer] of Occupational Health - Occupational Stress Questionnaire    Feeling of Stress: Not at all  Social Connections: Socially Integrated (08/08/2024)   Social Connection and Isolation Panel    Frequency of Communication with Friends and Family: Three times a week    Frequency of Social Gatherings with Friends and Family: Three times a week    Attends Religious Services: More than 4 times per year    Active Member of Clubs or Organizations: Yes    Attends Banker Meetings: More than 4 times per year    Marital Status: Married  Depression (PHQ2-9): Low Risk (08/08/2024)   Depression (PHQ2-9)    PHQ-2 Score: 0  Alcohol Screen: Low Risk (08/08/2024)   Alcohol Screen    Last Alcohol Screening Score (AUDIT): 2  Housing: Low Risk (08/08/2024)   Epic    Unable to Pay for Housing in the Last Year: No    Number of Times Moved in the Last Year: 0    Homeless in the Last Year: No  Utilities: Not At Risk (08/08/2024)   Epic    Threatened with loss of utilities: No  Health Literacy: Adequate Health  Literacy (08/08/2024)   B1300 Health Literacy    Frequency of need for help with medical instructions: Never   Family History: Family History  Problem Relation Age of Onset   Hypertension Mother    Prostate cancer Father 33   Heart disease Maternal Grandmother    Allergies: Allergies[1] Medications: See med rec.  Review of Systems: No headache, visual changes, nausea, vomiting, diarrhea, constipation, dizziness, abdominal pain, skin rash, fevers, chills, night sweats, swollen lymph nodes, weight loss, chest pain, body aches, joint swelling, muscle aches, shortness of breath, mood changes, visual or auditory hallucinations.  Objective:    BP (!) 157/100   Pulse 64   Ht 6' (1.829 m)   Wt 257 lb 4.8 oz (116.7 kg)   SpO2 98%   BMI 34.90 kg/m   General: Well  Developed, well nourished, and in no acute distress.  Neuro: Alert and oriented x3, extra-ocular muscles intact, sensation grossly intact. Cranial nerves II through XII are intact, motor, sensory, and coordinative functions are all intact. HEENT: Normocephalic, atraumatic, pupils equal round reactive to light, neck supple, no masses, no lymphadenopathy, thyroid nonpalpable. Oropharynx, nasopharynx, external ear canals are unremarkable. Skin: Warm and dry, no rashes noted. Cardiac: Regular rate and rhythm, no murmurs rubs or gallops. Respiratory: Clear to auscultation bilaterally. Not using accessory muscles, speaking in full sentences. Abdominal: Soft, nontender, nondistended, positive bowel sounds, no masses, no organomegaly. Musculoskeletal: Shoulder, elbow, wrist, hip, knee, ankle stable, and with full range of motion.  Impression and Recommendations:    Wellness examination Assessment & Plan: Routine HCM labs ordered. HCM reviewed/discussed. Anticipatory guidance regarding healthy weight, lifestyle and choices given. Recommend healthy diet.  Recommend approximately 150 minutes/week of moderate intensity exercise Recommend regular dental and vision exams Always use seatbelt/lap and shoulder restraints Recommend using smoke alarms and checking batteries at least twice a year Recommend using sunscreen when outside Discussed colon cancer screening recommendations, options.  Patient UTD Discussed recommendations for shingles vaccine.  Patient will consider Discussed immunization recommendations The natural history of prostate cancer and ongoing controversy regarding screening and potential treatment outcomes of prostate cancer has been discussed with the patient. The meaning of a false positive PSA and a false negative PSA has been discussed. He indicates understanding of the limitations of this screening test and wishes to proceed with screening PSA testing.  Orders: -     PSA Total (Reflex  To Free) -     Uric acid -     TSH Rfx on Abnormal to Free T4 -     Lipid panel -     Hemoglobin A1c -     Comprehensive metabolic panel with GFR -     CBC with Differential/Platelet  Chronic gout of right ankle, unspecified cause Assessment & Plan: Patient reports that he ran out of allopurinol  and has thus not been taking medication for about 3 weeks,.  When he was taking medication, denies any issues with medication.  He does note that he has not had any recent flareups from gout.  Discussed importance of continuing with allopurinol  to maintain lower uric acid level and reduce risk of gout flareups. We will restart allopurinol  at low-dose of 50 mg given underlying CKD. We will recheck uric acid in about 3 weeks to assess progress and adjust dose of allopurinol  accordingly.  Orders: -     Uric acid  Primary hypertension Assessment & Plan: Blood pressure elevated in office today.  Improved slightly on recheck.  We discussed considerations.  Can continue with current medication regimen.  We will check labs related to physical today. If making medication adjustments, likely would increase dose of losartan  He will return for nurse visit 3 weeks for blood pressure recheck   Prostate cancer screening -     PSA Total (Reflex To Free)  Other orders -     Allopurinol ; Take 0.5 tablets (50 mg total) by mouth daily.  Dispense: 30 tablet; Refill: 3  Return in about 3 months (around 11/06/2024) for hypertension, med check.   ___________________________________________ Shelina Luo de Cuba, MD, ABFM, CAQSM Primary Care and Sports Medicine [facility]    [1]  Allergies Allergen Reactions   Other     Nova Hair dye   Penicillins Other (See Comments)    unknown   "

## 2024-09-17 ENCOUNTER — Other Ambulatory Visit (HOSPITAL_BASED_OUTPATIENT_CLINIC_OR_DEPARTMENT_OTHER): Payer: Self-pay | Admitting: Family Medicine
